# Patient Record
Sex: Male | Born: 1990 | Race: White | Hispanic: Yes | Marital: Single | Smoking: Former smoker
Health system: Southern US, Community
[De-identification: ages and names within clinical notes are randomized; demographics above are authoritative.]

## PROBLEM LIST (undated history)

## (undated) DIAGNOSIS — T7840XA Allergy, unspecified, initial encounter: Secondary | ICD-10-CM

## (undated) DIAGNOSIS — R569 Unspecified convulsions: Secondary | ICD-10-CM

## (undated) HISTORY — DX: Unspecified convulsions: R56.9

## (undated) HISTORY — DX: Allergy, unspecified, initial encounter: T78.40XA

---
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
## 2011-04-22 IMAGING — CT CT HEAD WITHOUT CONTRAST
2 series · 15 of 30 positions shown, 19 images · non-contrast
Comparison: none

REASON FOR EXAM: new onset of seizure,Right frontal trauma
COMMENTS:

[Series 2: without · axial · non-contrast · 0.44mm/px · z∈[+956,+1086]mm · 13 of 32 slices shown, 17 images]
[im 3/32  brain]
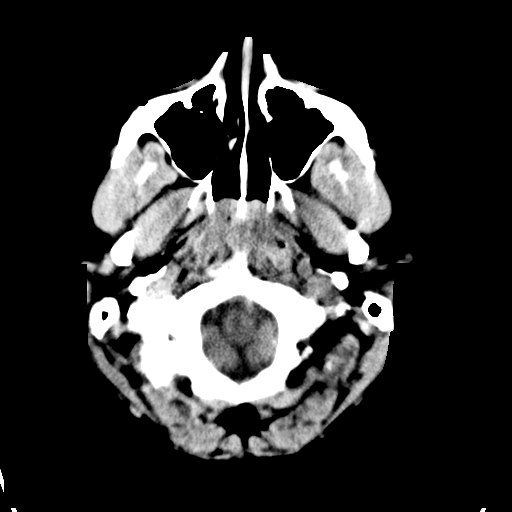
[im 3/32  bone]
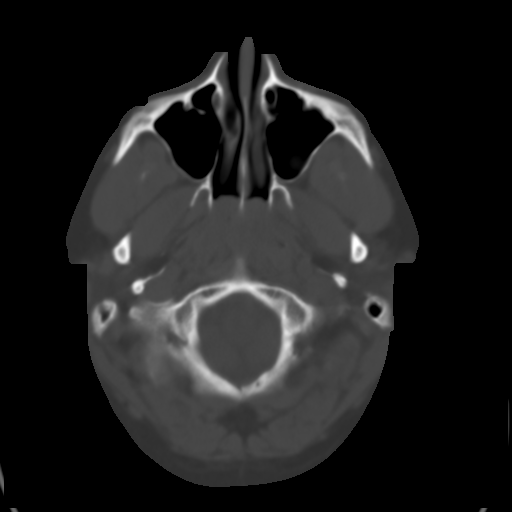
[im 5/32  brain]
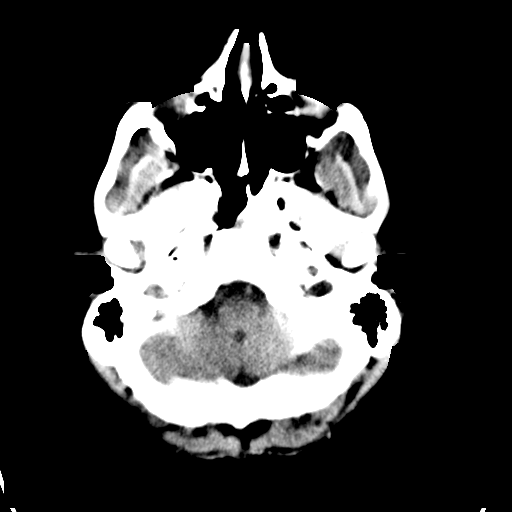
[im 7/32  brain]
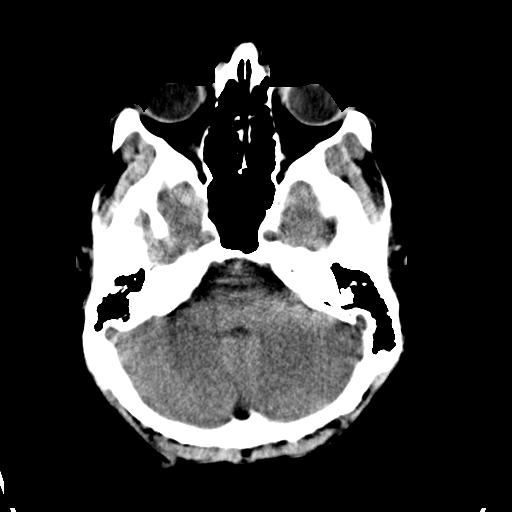
[im 9/32  brain]
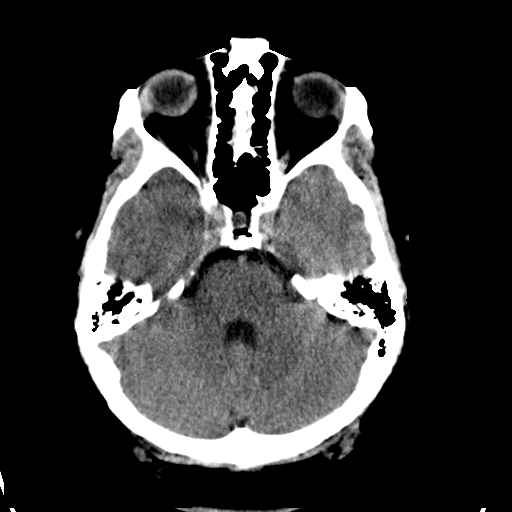
[im 12/32  brain]
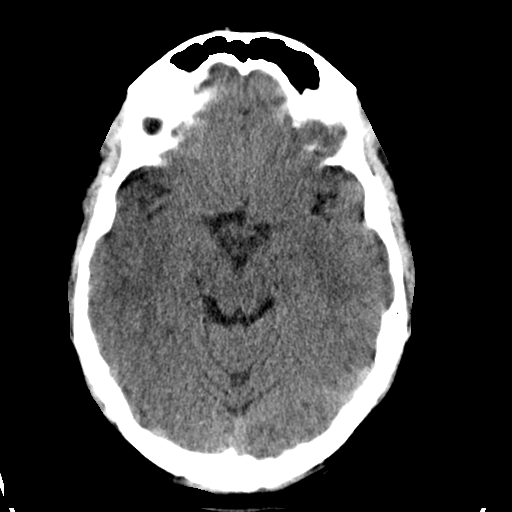
[im 12/32  bone]
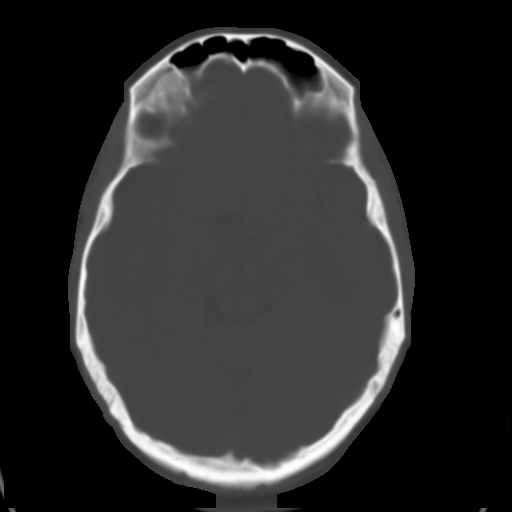
[im 14/32  brain]
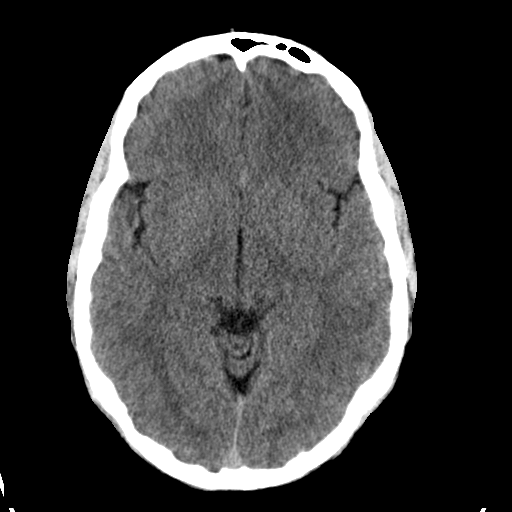
[im 16/32  brain]
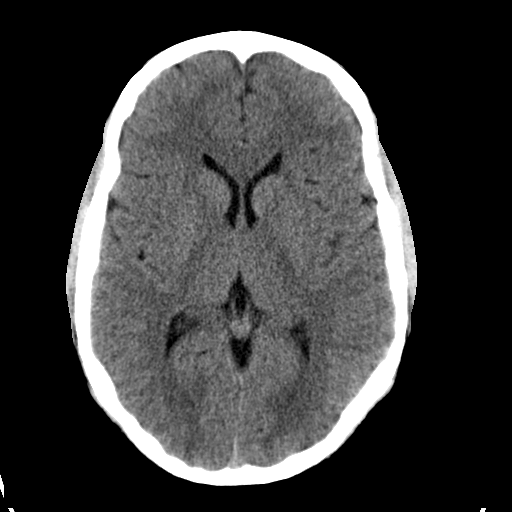
[im 18/32  brain]
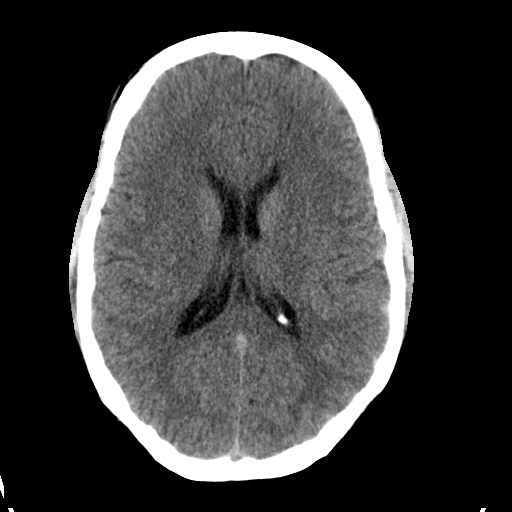
[im 20/32  brain]
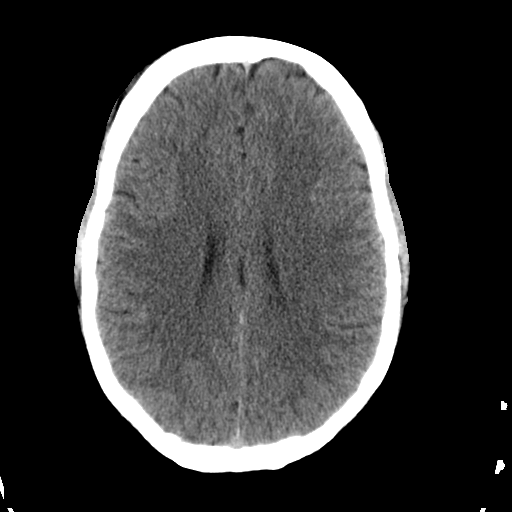
[im 20/32  bone]
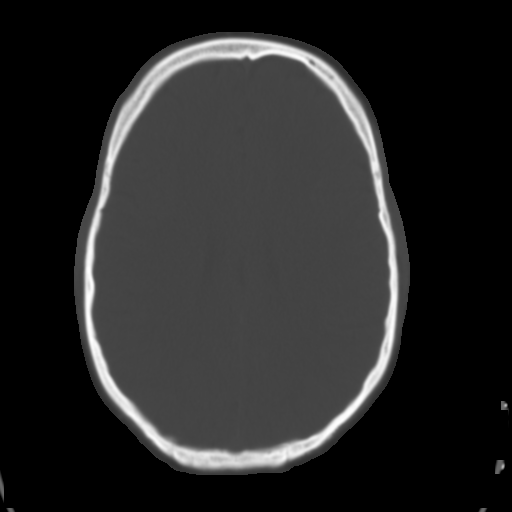
[im 23/32  brain]
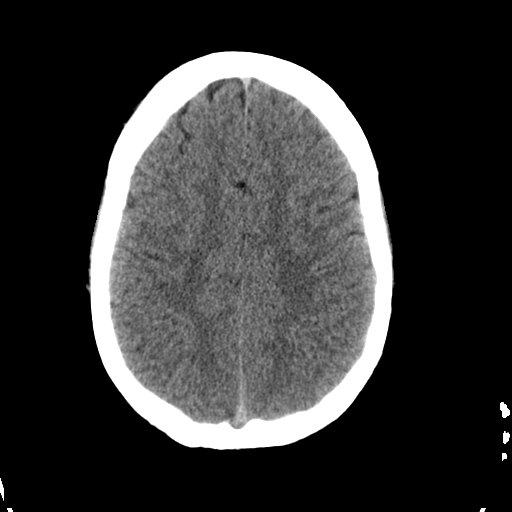
[im 25/32  brain]
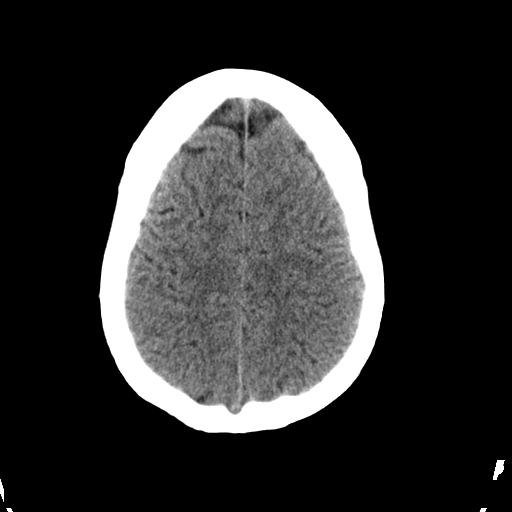
[im 27/32  brain]
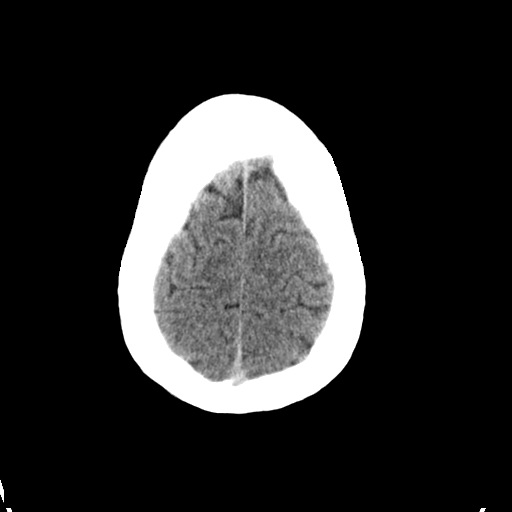
[im 29/32  brain]
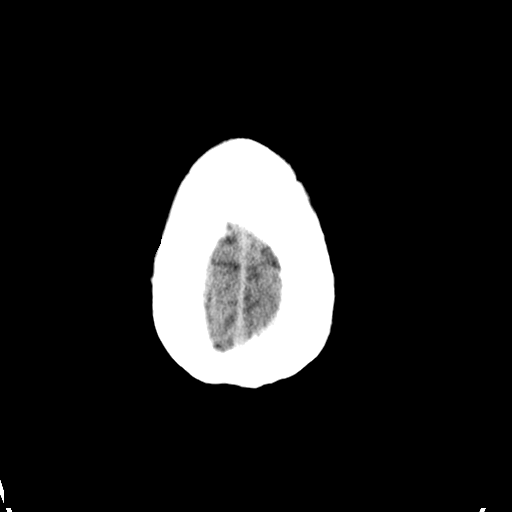
[im 29/32  bone]
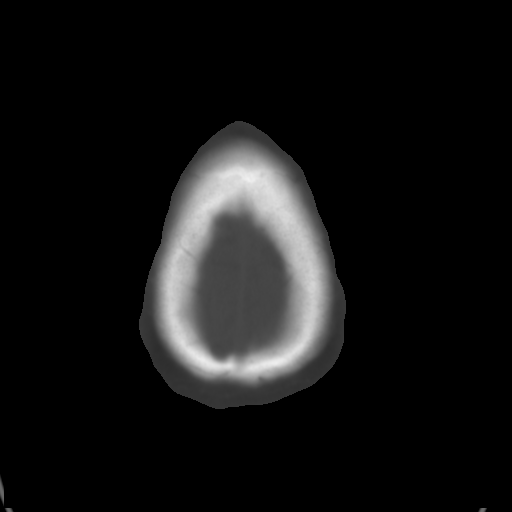

[Series 3: bone · axial · 0.44mm/px · z∈[+956,+976]mm · 2 of 32 slices shown]
[im 3/32  bone]
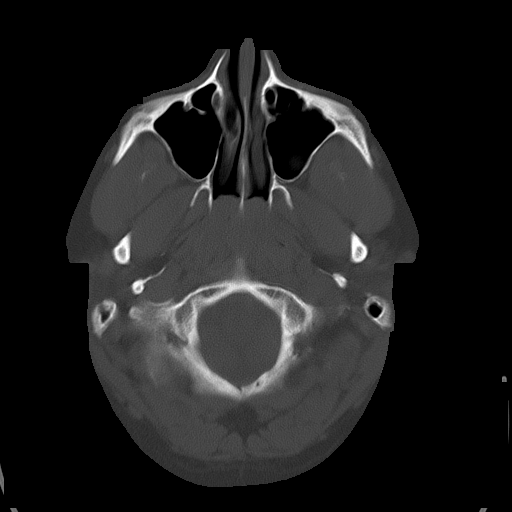
[im 7/32  bone]
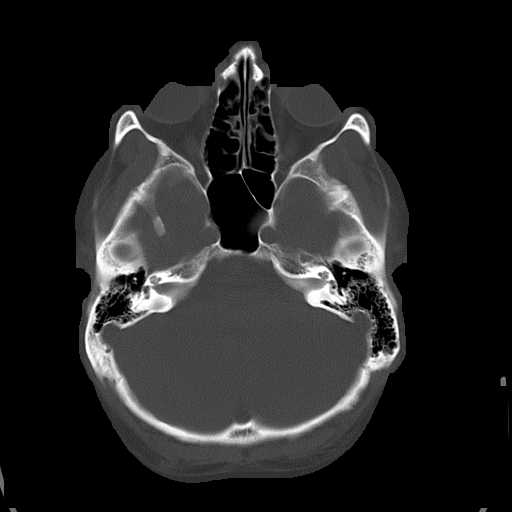

[15 of 30 positions shown; findings below may reference images not displayed]

PROCEDURE:     CT  - CT HEAD WITHOUT CONTRAST  - October 03, 2009  [DATE]

RESULT:

Helical 5mm sections were obtained from the skull base through the vertex
without the administration of intravenous contrast.

There is no evidence of intra-axial or extra-axial fluid collections. There
is no evidence of acute hemorrhage or secondary signs reflecting mass effect
or subacute or chronic focal territorial infarction. The osseous structures
demonstrate no evidence of a depressed skull fracture. If there is
persistent concern clinical, follow-up with MRI is recommended.
IMPRESSION: 1. No evidence of acute intracranial abnormalities.
2. Dr. Benda of the Emergency Department was informed of these findings at
the time of the initial interpretation.
3. Incidental note is made of a mucous retention cyst versus a polyp within
the base of left maxillary sinus.

## 2012-10-27 ENCOUNTER — Ambulatory Visit: Payer: Self-pay

## 2012-11-07 ENCOUNTER — Ambulatory Visit: Payer: Self-pay

## 2015-08-21 ENCOUNTER — Telehealth: Payer: Self-pay | Admitting: *Deleted

## 2019-08-18 ENCOUNTER — Ambulatory Visit (INDEPENDENT_AMBULATORY_CARE_PROVIDER_SITE_OTHER): Payer: BC Managed Care – PPO | Admitting: Family Medicine

## 2019-08-18 ENCOUNTER — Encounter: Payer: Self-pay | Admitting: Family Medicine

## 2019-08-18 ENCOUNTER — Other Ambulatory Visit: Payer: Self-pay

## 2019-08-18 VITALS — BP 134/82 | HR 77 | Temp 98.1°F | Ht 75.5 in | Wt 353.0 lb

## 2019-08-18 DIAGNOSIS — F329 Major depressive disorder, single episode, unspecified: Secondary | ICD-10-CM | POA: Diagnosis not present

## 2019-08-18 DIAGNOSIS — Z7689 Persons encountering health services in other specified circumstances: Secondary | ICD-10-CM | POA: Diagnosis not present

## 2019-08-18 DIAGNOSIS — R152 Fecal urgency: Secondary | ICD-10-CM | POA: Diagnosis not present

## 2019-08-18 DIAGNOSIS — F419 Anxiety disorder, unspecified: Secondary | ICD-10-CM

## 2019-08-18 MED ORDER — DICYCLOMINE HCL 10 MG PO CAPS: 10.0000 mg | ORAL_CAPSULE | Freq: Three times a day (TID) | ORAL | 0 refills | Status: DC

## 2019-08-18 MED ORDER — VENLAFAXINE HCL ER 37.5 MG PO CP24: 37.5000 mg | ORAL_CAPSULE | Freq: Every day | ORAL | 0 refills | Status: DC

## 2019-08-18 MED ORDER — HYDROXYZINE HCL 25 MG PO TABS: 25.0000 mg | ORAL_TABLET | Freq: Three times a day (TID) | ORAL | 0 refills | Status: DC | PRN

## 2019-08-18 NOTE — Progress Notes (Signed)
BP 134/82   Pulse 77   Temp 98.1 F (36.7 C) (Oral)   Ht 6' 3.5" (1.918 m)   Wt (!) 353 lb (160.1 kg)   SpO2 97%   BMI 43.54 kg/m    Subjective:    Patient ID: William Reid, male    DOB: 03-27-1991, 29 y.o.   MRN: [id number]  HPI: [name] is a 29 y.o. male  Chief Complaint  Patient presents with  . Establish Care    pt would like to discuss about stomch problems, anxiety, depression, sexual dysfunction and check his thyroids  . Foot Pain    left foot   Here today to establish care.   Main concern today is depression/anxiety. Has never been diagnosed in the past but has had issues all of his life. The past 2 years things have been worsening and about 4 years ago considered in a serious way taking his life, still having some SI/HI several times monthly but states it's mostly passive at this point. Recently met a serious girlfriend who he states has been very good for him and has emotionally supported him a great deal. Has never tried medications or counseling in the past but is open to all.   Concerned about bowel irritation, often after meals will have to run to the bathroom. Urgent need for a bathroom, not always diarrhea. Certain foods seem worse than others - [place] food, [place] food seem to be the worst triggers. Probiotics have maybe helped him some. Denies bloody stools, constipation, abdominal pain, N/V, weight loss.   Relevant past medical, surgical, family and social history reviewed and updated as indicated. Interim medical history since our last visit reviewed. Allergies and medications reviewed and updated.  Review of Systems  Per HPI unless specifically indicated above     Objective:    BP 134/82   Pulse 77   Temp 98.1 F (36.7 C) (Oral)   Ht 6' 3.5" (1.918 m)   Wt (!) 353 lb (160.1 kg)   SpO2 97%   BMI 43.54 kg/m   Wt Readings from Last 3 Encounters:  08/18/19 (!) 353 lb (160.1 kg)    Physical Exam Vitals and nursing note reviewed.    Constitutional:      Appearance: Normal appearance.  HENT:     Head: Atraumatic.  Eyes:     Extraocular Movements: Extraocular movements intact.     Conjunctiva/sclera: Conjunctivae normal.  Cardiovascular:     Rate and Rhythm: Normal rate and regular rhythm.  Pulmonary:     Effort: Pulmonary effort is normal.     Breath sounds: Normal breath sounds.  Musculoskeletal:        General: Normal range of motion.     Cervical back: Normal range of motion and neck supple.  Skin:    General: Skin is warm and dry.  Neurological:     General: No focal deficit present.     Mental Status: He is oriented to person, place, and time.  Psychiatric:        Mood and Affect: Mood normal.        Thought Content: Thought content normal.        Judgment: Judgment normal.     No results found for this or any previous visit.    Assessment & Plan:   Problem List Items Addressed This Visit      Other   Anxiety and depression - Primary    Given severity of sxs and hx  of SI at times with intent, will place urgent referral to Psychiatry. Will start effexor and prn hydroxyzine in meantime and monitor closely for benefit prior to establishing with specialist. Long discussion regarding ER, mobile crisis info, or multiple walk in Psychiatry clinics in area in case having SI/HI and pt contracted for safety      Relevant Medications   venlafaxine XR (EFFEXOR XR) 37.5 MG 24 hr capsule   hydrOXYzine (ATARAX/VISTARIL) 25 MG tablet   Other Relevant Orders   Ambulatory referral to Psychiatry    Other Visit Diagnoses    Encounter to establish care       Pt with numerous other concerns today that were not yet evaluated, will eval in detail at upcoming CPE   Fecal urgency       Tx with prn bentyl, fiber, continued probiotics. F/u at upcoming CPE on sxs       Follow up plan: Return for CPE.

## 2019-08-20 DIAGNOSIS — F419 Anxiety disorder, unspecified: Secondary | ICD-10-CM | POA: Insufficient documentation

## 2019-08-20 DIAGNOSIS — F32A Depression, unspecified: Secondary | ICD-10-CM | POA: Insufficient documentation

## 2019-08-20 NOTE — Assessment & Plan Note (Signed)
Given severity of sxs and hx of SI at times with intent, will place urgent referral to Psychiatry. Will start effexor and prn hydroxyzine in meantime and monitor closely for benefit prior to establishing with specialist. Long discussion regarding ER, mobile crisis info, or multiple walk in Psychiatry clinics in area in case having SI/HI and pt contracted for safety

## 2019-08-24 ENCOUNTER — Encounter: Payer: BC Managed Care – PPO | Admitting: Family Medicine

## 2019-08-31 ENCOUNTER — Encounter: Payer: Self-pay | Admitting: Family Medicine

## 2019-08-31 ENCOUNTER — Other Ambulatory Visit: Payer: Self-pay

## 2019-08-31 ENCOUNTER — Ambulatory Visit (INDEPENDENT_AMBULATORY_CARE_PROVIDER_SITE_OTHER): Payer: BC Managed Care – PPO | Admitting: Family Medicine

## 2019-08-31 VITALS — BP 136/86 | HR 68 | Temp 98.0°F | Ht 75.0 in | Wt 352.0 lb

## 2019-08-31 DIAGNOSIS — Z23 Encounter for immunization: Secondary | ICD-10-CM

## 2019-08-31 DIAGNOSIS — Z136 Encounter for screening for cardiovascular disorders: Secondary | ICD-10-CM | POA: Diagnosis not present

## 2019-08-31 DIAGNOSIS — Z0000 Encounter for general adult medical examination without abnormal findings: Secondary | ICD-10-CM | POA: Diagnosis not present

## 2019-08-31 DIAGNOSIS — F419 Anxiety disorder, unspecified: Secondary | ICD-10-CM | POA: Diagnosis not present

## 2019-08-31 DIAGNOSIS — F329 Major depressive disorder, single episode, unspecified: Secondary | ICD-10-CM

## 2019-08-31 DIAGNOSIS — Z114 Encounter for screening for human immunodeficiency virus [HIV]: Secondary | ICD-10-CM | POA: Diagnosis not present

## 2019-08-31 DIAGNOSIS — R197 Diarrhea, unspecified: Secondary | ICD-10-CM

## 2019-08-31 DIAGNOSIS — F32A Depression, unspecified: Secondary | ICD-10-CM

## 2019-08-31 LAB — UA/M W/RFLX CULTURE, ROUTINE
Bilirubin, UA: NEGATIVE
Glucose, UA: NEGATIVE
Ketones, UA: NEGATIVE
Leukocytes,UA: NEGATIVE
Nitrite, UA: NEGATIVE
Protein,UA: NEGATIVE
Specific Gravity, UA: 1.02 (ref 1.005–1.030)
Urobilinogen, Ur: 0.2 mg/dL (ref 0.2–1.0)
pH, UA: 6.5 (ref 5.0–7.5)

## 2019-08-31 LAB — MICROSCOPIC EXAMINATION
Bacteria, UA: NONE SEEN
WBC, UA: NONE SEEN /hpf (ref 0–5)

## 2019-08-31 NOTE — Assessment & Plan Note (Signed)
Improving with current regimen, may try increasing effexor to 2 capsules daily and monitoring for benefit. Await est care with Psychiatry for further mgmt

## 2019-08-31 NOTE — Progress Notes (Signed)
BP 136/86   Pulse 68   Temp 98 F (36.7 C) (Oral)   Ht 6\' 3"  (1.905 m)   Wt (!) 352 lb (159.7 kg)   SpO2 97%   BMI 44.00 kg/m    Subjective:    Patient ID: , male    DOB: [birth year], 29 y.o.   MRN: [id number]  HPI: William Reid is a 29 y.o. male presenting on 08/31/2019 for comprehensive medical examination. Current medical complaints include:see below  Anxiety and depression - Taking the hydroxyzine 1-2 times daily, and notes significant benefits with the effexor. Denies side effects, SI/HI. Awaiting est care with Psychiatry.   The bentyl has been helping calm down his diarrhea and urgency. Has only taken it a few times so far but does note improvement. No new concerns there.   He currently lives with: Interim Problems from his last visit: no  Depression Screen done today and results listed below:  Depression screen [facility] 2/9 08/31/2019 08/18/2019  Decreased Interest 1 2  Down, Depressed, Hopeless 1 3  PHQ - 2 Score 2 5  Altered sleeping 1 3  Tired, decreased energy 1 3  Change in appetite 2 1  Feeling bad or failure about yourself  2 2  Trouble concentrating 2 2  Moving slowly or fidgety/restless 1 1  Suicidal thoughts 1 2  PHQ-9 Score 12 19    The patient does not have a history of falls. I did complete a risk assessment for falls. A plan of care for falls was documented.   Past Medical History:  Past Medical History:  Diagnosis Date  . Seizures (HCC)     Surgical History:  No past surgical history on file.  Medications:  Current Outpatient Medications on File Prior to Visit  Medication Sig  . dicyclomine (BENTYL) 10 MG capsule Take 1 capsule (10 mg total) by mouth 4 (four) times daily -  before meals and at bedtime.  . hydrOXYzine (ATARAX/VISTARIL) 25 MG tablet Take 1 tablet (25 mg total) by mouth 3 (three) times daily as needed.  . Multiple Vitamin (MULTIVITAMIN PO) Take by mouth daily.  . Probiotic Product (PROBIOTIC DAILY PO) Take by mouth  daily.  08/20/2019 UNABLE TO FIND Take by mouth 2 (two) times daily. Equate Vitality Complex for Men with Testofen  . venlafaxine XR (EFFEXOR XR) 37.5 MG 24 hr capsule Take 1 capsule (37.5 mg total) by mouth daily with breakfast.   No current facility-administered medications on file prior to visit.    Allergies:  No Known Allergies  Social History:  Social History   Socioeconomic History  . Marital status: Single    Spouse name: Not on file  . Number of children: Not on file  . Years of education: Not on file  . Highest education level: Not on file  Occupational History  . Not on file  Tobacco Use  . Smoking status: Former Marland Kitchen  . Smokeless tobacco: Never Used  Substance and Sexual Activity  . Alcohol use: Yes    Comment: occasionally  . Drug use: Not Currently    Types: Marijuana  . Sexual activity: Yes  Other Topics Concern  . Not on file  Social History Narrative  . Not on file   Social Determinants of Health   Financial Resource Strain:   . Difficulty of Paying Living Expenses:   Food Insecurity:   . Worried About Games developer in the Last Year:   . Programme researcher, broadcasting/film/video of [employer]  in the Last Year:   Transportation Needs:   . Freight forwarder (Medical):   Marland Kitchen Lack of Transportation (Non-Medical):   Physical Activity:   . Days of Exercise per Week:   . Minutes of Exercise per Session:   Stress:   . Feeling of Stress :   Social Connections:   . Frequency of Communication with Friends and Family:   . Frequency of Social Gatherings with Friends and Family:   . Attends Religious Services:   . Active Member of Clubs or Organizations:   . Attends Banker Meetings:   Marland Kitchen Marital Status:   Intimate Partner Violence:   . Fear of Current or Ex-Partner:   . Emotionally Abused:   Marland Kitchen Physically Abused:   . Sexually Abused:    Social History   Tobacco Use  Smoking Status Former Smoker  Smokeless Tobacco Never Used   Social History   Substance and Sexual  Activity  Alcohol Use Yes   Comment: occasionally    Family History:  Family History  Problem Relation Age of Onset  . Epilepsy Father   . Hypertension Father     Past medical history, surgical history, medications, allergies, family history and social history reviewed with patient today and changes made to appropriate areas of the chart.   Review of Systems - General ROS: negative Psychological ROS: negative Ophthalmic ROS: negative ENT ROS: negative Allergy and Immunology ROS: negative Hematological and Lymphatic ROS: negative Endocrine ROS: negative Breast ROS: negative for breast lumps Respiratory ROS: no cough, shortness of breath, or wheezing Cardiovascular ROS: no chest pain or dyspnea on exertion Gastrointestinal ROS: no abdominal pain, change in bowel habits, or black or bloody stools Genito-Urinary ROS: no dysuria, trouble voiding, or hematuria Musculoskeletal ROS: negative Neurological ROS: no TIA or stroke symptoms Dermatological ROS: negative All other ROS negative except what is listed above and in the HPI.      Objective:    BP 136/86   Pulse 68   Temp 98 F (36.7 C) (Oral)   Ht 6\' 3"  (1.905 m)   Wt (!) 352 lb (159.7 kg)   SpO2 97%   BMI 44.00 kg/m   Wt Readings from Last 3 Encounters:  08/31/19 (!) 352 lb (159.7 kg)  08/18/19 (!) 353 lb (160.1 kg)    Physical Exam Vitals and nursing note reviewed.  Constitutional:      General: He is not in acute distress.    Appearance: He is well-developed.  HENT:     Head: Atraumatic.     Right Ear: Tympanic membrane and external ear normal.     Left Ear: Tympanic membrane and external ear normal.     Nose: Nose normal.     Mouth/Throat:     Mouth: Mucous membranes are moist.     Pharynx: Oropharynx is clear.  Eyes:     General: No scleral icterus.    Conjunctiva/sclera: Conjunctivae normal.     Pupils: Pupils are equal, round, and reactive to light.  Cardiovascular:     Rate and Rhythm: Normal rate  and regular rhythm.     Heart sounds: Normal heart sounds. No murmur.  Pulmonary:     Effort: Pulmonary effort is normal. No respiratory distress.     Breath sounds: Normal breath sounds.  Abdominal:     General: Bowel sounds are normal. There is no distension.     Palpations: Abdomen is soft. There is no mass.     Tenderness: There is no  abdominal tenderness. There is no guarding.  Genitourinary:    Comments: GU exam declined Musculoskeletal:        General: No tenderness. Normal range of motion.     Cervical back: Normal range of motion and neck supple.  Skin:    General: Skin is warm and dry.     Findings: No rash.  Neurological:     General: No focal deficit present.     Mental Status: He is alert and oriented to person, place, and time.     Deep Tendon Reflexes: Reflexes are normal and symmetric.  Psychiatric:        Mood and Affect: Mood normal.        Behavior: Behavior normal.        Thought Content: Thought content normal.        Judgment: Judgment normal.    No results found for this or any previous visit.    Assessment & Plan:   Problem List Items Addressed This Visit      Other   Anxiety and depression - Primary    Improving with current regimen, may try increasing effexor to 2 capsules daily and monitoring for benefit. Await est care with Psychiatry for further mgmt       Other Visit Diagnoses    Annual physical exam       Relevant Orders   CBC with Differential/Platelet   Comprehensive metabolic panel   TSH   UA/M w/rflx Culture, Routine   Diarrhea, unspecified type       Improved on bentyl, recommended probiotics, diet changes. Continue to monitor   Need for Tdap vaccination       Relevant Orders   Tdap vaccine greater than or equal to 7yo IM (Completed)   Screening for cardiovascular condition       Relevant Orders   Lipid Panel w/o Chol/HDL Ratio   Encounter for screening for HIV       Relevant Orders   HIV Antibody (routine testing w rflx)        Discussed aspirin prophylaxis for myocardial infarction prevention and decision was it was not indicated  LABORATORY TESTING:  Health maintenance labs ordered today as discussed above.   The natural history of prostate cancer and ongoing controversy regarding screening and potential treatment outcomes of prostate cancer has been discussed with the patient. The meaning of a false positive PSA and a false negative PSA has been discussed. He indicates understanding of the limitations of this screening test and wishes not to proceed with screening PSA testing.   IMMUNIZATIONS:   - Tdap: Tetanus vaccination status reviewed: Td vaccination indicated and given today. - Influenza: Postponed to flu season  PATIENT COUNSELING:    Sexuality: Discussed sexually transmitted diseases, partner selection, use of condoms, avoidance of unintended pregnancy  and contraceptive alternatives.   Advised to avoid cigarette smoking.  I discussed with the patient that most people either abstain from alcohol or drink within safe limits (<=14/week and <=4 drinks/occasion for males, <=7/weeks and <= 3 drinks/occasion for females) and that the risk for alcohol disorders and other health effects rises proportionally with the number of drinks per week and how often a drinker exceeds daily limits.  Discussed cessation/primary prevention of drug use and availability of treatment for abuse.   Diet: Encouraged to adjust caloric intake to maintain  or achieve ideal body weight, to reduce intake of dietary saturated fat and total fat, to limit sodium intake by avoiding high sodium foods and  not adding table salt, and to maintain adequate dietary potassium and calcium preferably from fresh fruits, vegetables, and low-fat dairy products.    stressed the importance of regular exercise  Injury prevention: Discussed safety belts, safety helmets, smoke detector, smoking near bedding or upholstery.   Dental health: Discussed  importance of regular tooth brushing, flossing, and dental visits.   Follow up plan: NEXT PREVENTATIVE PHYSICAL DUE IN 1 YEAR. Return in about 1 year (around 08/30/2020) for CPE.

## 2019-09-01 ENCOUNTER — Encounter: Payer: Self-pay | Admitting: Family Medicine

## 2019-09-01 LAB — CBC WITH DIFFERENTIAL/PLATELET
Basophils Absolute: 0.1 10*3/uL (ref 0.0–0.2)
Basos: 1 %
EOS (ABSOLUTE): 0.3 10*3/uL (ref 0.0–0.4)
Eos: 4 %
Hematocrit: 46.3 % (ref 37.5–51.0)
Hemoglobin: 15.3 g/dL (ref 13.0–17.7)
Immature Grans (Abs): 0 10*3/uL (ref 0.0–0.1)
Immature Granulocytes: 0 %
Lymphocytes Absolute: 2.2 10*3/uL (ref 0.7–3.1)
Lymphs: 30 %
MCH: 29.2 pg (ref 26.6–33.0)
MCHC: 33 g/dL (ref 31.5–35.7)
MCV: 88 fL (ref 79–97)
Monocytes Absolute: 0.5 10*3/uL (ref 0.1–0.9)
Monocytes: 7 %
Neutrophils Absolute: 4.2 10*3/uL (ref 1.4–7.0)
Neutrophils: 58 %
Platelets: 239 10*3/uL (ref 150–450)
RBC: 5.24 x10E6/uL (ref 4.14–5.80)
RDW: 12.3 % (ref 11.6–15.4)
WBC: 7.2 10*3/uL (ref 3.4–10.8)

## 2019-09-01 LAB — LIPID PANEL W/O CHOL/HDL RATIO
Cholesterol, Total: 186 mg/dL (ref 100–199)
HDL: 32 mg/dL — ABNORMAL LOW (ref >39)
LDL Chol Calc (NIH): 123 mg/dL — ABNORMAL HIGH (ref 0–99)
Triglycerides: 175 mg/dL — ABNORMAL HIGH (ref 0–149)
VLDL Cholesterol Cal: 31 mg/dL (ref 5–40)

## 2019-09-01 LAB — COMPREHENSIVE METABOLIC PANEL
ALT: 34 IU/L (ref 0–44)
AST: 28 IU/L (ref 0–40)
Albumin/Globulin Ratio: 1.6 (ref 1.2–2.2)
Albumin: 4.6 g/dL (ref 4.1–5.2)
Alkaline Phosphatase: 95 IU/L (ref 48–121)
BUN/Creatinine Ratio: 13 (ref 9–20)
BUN: 11 mg/dL (ref 6–20)
Bilirubin Total: 0.4 mg/dL (ref 0.0–1.2)
CO2: 21 mmol/L (ref 20–29)
Calcium: 9.5 mg/dL (ref 8.7–10.2)
Chloride: 101 mmol/L (ref 96–106)
Creatinine, Ser: 0.86 mg/dL (ref 0.76–1.27)
GFR calc Af Amer: 135 mL/min/{1.73_m2} (ref >59)
GFR calc non Af Amer: 117 mL/min/{1.73_m2} (ref >59)
Globulin, Total: 2.8 g/dL (ref 1.5–4.5)
Glucose: 82 mg/dL (ref 65–99)
Potassium: 4.3 mmol/L (ref 3.5–5.2)
Sodium: 138 mmol/L (ref 134–144)
Total Protein: 7.4 g/dL (ref 6.0–8.5)

## 2019-09-01 LAB — HIV ANTIBODY (ROUTINE TESTING W REFLEX): HIV Screen 4th Generation wRfx: NONREACTIVE

## 2019-09-01 LAB — TSH: TSH: 0.912 u[IU]/mL (ref 0.450–4.500)

## 2019-09-02 ENCOUNTER — Other Ambulatory Visit: Payer: Self-pay | Admitting: Family Medicine

## 2019-09-02 NOTE — Telephone Encounter (Signed)
Requested Prescriptions  Pending Prescriptions Disp Refills  . dicyclomine (BENTYL) 10 MG capsule [Pharmacy Med Name: DICYCLOMINE 10 MG CAPSULE] 90 capsule 0    Sig: TAKE 1 CAPSULE (10 MG TOTAL) BY MOUTH 4 (FOUR) TIMES DAILY - BEFORE MEALS AND AT BEDTIME.     Gastroenterology:  Antispasmodic Agents Passed - 09/02/2019  9:31 AM      Passed - Last Heart Rate in normal range    Pulse Readings from Last 1 Encounters:  08/31/19 68         Passed - Valid encounter within last 12 months    Recent Outpatient Visits          2 days ago Anxiety and depression   [facility] [place] Part[place], [place]   2 weeks ago Anxiety and depression   [facility] [place] M[place], [place]

## 2019-09-10 ENCOUNTER — Other Ambulatory Visit: Payer: Self-pay | Admitting: Family Medicine

## 2019-10-16 ENCOUNTER — Ambulatory Visit: Payer: BC Managed Care – PPO | Admitting: Family Medicine

## 2019-11-03 ENCOUNTER — Ambulatory Visit: Payer: BC Managed Care – PPO | Admitting: Family Medicine

## 2019-11-03 ENCOUNTER — Other Ambulatory Visit: Payer: Self-pay

## 2019-11-03 ENCOUNTER — Encounter: Payer: Self-pay | Admitting: Family Medicine

## 2019-11-03 VITALS — BP 137/82 | HR 76 | Temp 98.2°F | Wt 364.0 lb

## 2019-11-03 DIAGNOSIS — J351 Hypertrophy of tonsils: Secondary | ICD-10-CM | POA: Diagnosis not present

## 2019-11-03 NOTE — Patient Instructions (Signed)
Zyrtec in the AM, flonase or rhinocort nasal spray twice daily

## 2019-11-03 NOTE — Progress Notes (Signed)
BP 137/82   Pulse 76   Temp 98.2 F (36.8 C) (Oral)   Wt (!) 364 lb (165.1 kg)   SpO2 98%   BMI 45.50 kg/m    Subjective:    Patient ID: William Reid, male    DOB: [birth year], 30 y.o.   MRN: [id number]  HPI: William Reid is a 29 y.o. male  Chief Complaint  Patient presents with  . Sore Throat    pt states that has had swollen tonsiles for about 2 months   Swollen tonsils the past 2 months to where he's choking on saliva and while eating at times. Choking in his sleep often as well. No pain, fever, chills, N/V. Does have a hx of allergies and notes some post nasal drainage, not on anything for this. Notes he's not had such a noticeable issue with his tonsils that he can remember in the past.   Relevant past medical, surgical, family and social history reviewed and updated as indicated. Interim medical history since our last visit reviewed. Allergies and medications reviewed and updated.  Review of Systems  Per HPI unless specifically indicated above     Objective:    BP 137/82   Pulse 76   Temp 98.2 F (36.8 C) (Oral)   Wt (!) 364 lb (165.1 kg)   SpO2 98%   BMI 45.50 kg/m   Wt Readings from Last 3 Encounters:  11/03/19 (!) 364 lb (165.1 kg)  08/31/19 (!) 352 lb (159.7 kg)  08/18/19 (!) 353 lb (160.1 kg)    Physical Exam Vitals and nursing note reviewed.  Constitutional:      Appearance: Normal appearance.  HENT:     Head: Atraumatic.     Right Ear: Tympanic membrane normal.     Left Ear: Tympanic membrane normal.     Nose: Nose normal.     Mouth/Throat:     Mouth: Mucous membranes are moist.     Pharynx: Oropharynx is clear. Posterior oropharyngeal erythema present.     Comments: Tonsils edematous b/l, right significantly larger than left. Not cryptic, no exudates  Eyes:     Extraocular Movements: Extraocular movements intact.     Conjunctiva/sclera: Conjunctivae normal.  Neck:     Comments: No thyromegaly appreciated on exam Cardiovascular:      Rate and Rhythm: Normal rate and regular rhythm.  Pulmonary:     Effort: Pulmonary effort is normal.     Breath sounds: Normal breath sounds.  Musculoskeletal:        General: Normal range of motion.     Cervical back: Normal range of motion and neck supple.  Skin:    General: Skin is warm and dry.  Neurological:     General: No focal deficit present.     Mental Status: He is oriented to person, place, and time.  Psychiatric:        Mood and Affect: Mood normal.        Thought Content: Thought content normal.        Judgment: Judgment normal.     Results for orders placed or performed in visit on 08/31/19  Microscopic Examination   URINE  Result Value Ref Range   WBC, UA None seen 0 - 5 /hpf   RBC 0-2 0 - 2 /hpf   Epithelial Cells (non renal) 0-10 0 - 10 /hpf   Bacteria, UA None seen None seen/Few  HIV Antibody (routine testing w rflx)  Result Value Ref Range  HIV Screen 4th Generation wRfx Non Reactive Non Reactive  CBC with Differential/Platelet  Result Value Ref Range   WBC 7.2 3.4 - 10.8 x10E3/uL   RBC 5.24 4.14 - 5.80 x10E6/uL   Hemoglobin 15.3 13.0 - 17.7 g/dL   Hematocrit 77.9 39.0 - 51.0 %   MCV 88 79 - 97 fL   MCH 29.2 26.6 - 33.0 pg   MCHC 33.0 31 - 35 g/dL   RDW 30.0 92.3 - 30.0 %   Platelets 239 150 - 450 x10E3/uL   Neutrophils 58 Not Estab. %   Lymphs 30 Not Estab. %   Monocytes 7 Not Estab. %   Eos 4 Not Estab. %   Basos 1 Not Estab. %   Neutrophils Absolute 4.2 1 - 7 x10E3/uL   Lymphocytes Absolute 2.2 0 - 3 x10E3/uL   Monocytes Absolute 0.5 0 - 0 x10E3/uL   EOS (ABSOLUTE) 0.3 0.0 - 0.4 x10E3/uL   Basophils Absolute 0.1 0 - 0 x10E3/uL   Immature Granulocytes 0 Not Estab. %   Immature Grans (Abs) 0.0 0.0 - 0.1 x10E3/uL  Comprehensive metabolic panel  Result Value Ref Range   Glucose 82 65 - 99 mg/dL   BUN 11 6 - 20 mg/dL   Creatinine, Ser 7.62 0.76 - 1.27 mg/dL   GFR calc non Af Amer 117 >59 mL/min/1.73   GFR calc Af Amer 135 >59 mL/min/1.73     BUN/Creatinine Ratio 13 9 - 20   Sodium 138 134 - 144 mmol/L   Potassium 4.3 3.5 - 5.2 mmol/L   Chloride 101 96 - 106 mmol/L   CO2 21 20 - 29 mmol/L   Calcium 9.5 8.7 - 10.2 mg/dL   Total Protein 7.4 6.0 - 8.5 g/dL   Albumin 4.6 4.1 - 5.2 g/dL   Globulin, Total 2.8 1.5 - 4.5 g/dL   Albumin/Globulin Ratio 1.6 1.2 - 2.2   Bilirubin Total 0.4 0.0 - 1.2 mg/dL   Alkaline Phosphatase 95 48 - 121 IU/L   AST 28 0 - 40 IU/L   ALT 34 0 - 44 IU/L  Lipid Panel w/o Chol/HDL Ratio  Result Value Ref Range   Cholesterol, Total 186 100 - 199 mg/dL   Triglycerides 263 (H) 0 - 149 mg/dL   HDL 32 (L) >33 mg/dL   VLDL Cholesterol Cal 31 5 - 40 mg/dL   LDL Chol Calc (NIH) 545 (H) 0 - 99 mg/dL  TSH  Result Value Ref Range   TSH 0.912 0.450 - 4.500 uIU/mL  UA/M w/rflx Culture, Routine   Specimen: Urine   URINE  Result Value Ref Range   Specific Gravity, UA 1.020 1.005 - 1.030   pH, UA 6.5 5.0 - 7.5   Color, UA Yellow Yellow   Appearance Ur Clear Clear   Leukocytes,UA Negative Negative   Protein,UA Negative Negative/Trace   Glucose, UA Negative Negative   Ketones, UA Negative Negative   RBC, UA Trace (A) Negative   Bilirubin, UA Negative Negative   Urobilinogen, Ur 0.2 0.2 - 1.0 mg/dL   Nitrite, UA Negative Negative   Microscopic Examination See below:       Assessment & Plan:   Problem List Items Addressed This Visit    None    Visit Diagnoses    Swollen tonsil    -  Primary   Zyrtec and flonase recommended to reduce post nasal drainage, but given significant size and sensation of choking refer to ENT for further eval and mgmt as well  Relevant Orders   Ambulatory referral to ENT       Follow up plan: Return if symptoms worsen or fail to improve.

## 2019-11-06 ENCOUNTER — Ambulatory Visit: Payer: Self-pay | Attending: Internal Medicine

## 2019-11-06 DIAGNOSIS — Z23 Encounter for immunization: Secondary | ICD-10-CM

## 2019-11-06 NOTE — Progress Notes (Signed)
   Covid-19 Vaccination Clinic  Name:  William Reid    MRN: [id number] DOB: 13-Dec-1990  11/06/2019  Mr. Difatta was observed post Covid-19 immunization for 15 minutes without incident. He was provided with Vaccine Information Sheet and instruction to access the V-Safe system.   Mr. Strohmeier was instructed to call 911 with any severe reactions post vaccine: Marland Kitchen Difficulty breathing  . Swelling of face and throat  . A fast heartbeat  . A bad rash all over body  . Dizziness and weakness   Immunizations Administered    Name Date Dose VIS Date Route   Moderna COVID-19 Vaccine 11/06/2019 11:39 AM 0.5 mL 02/2019 Intramuscular   Manufacturer: Moderna   Lot: [id number]   NDC: 67014-103-01

## 2020-10-18 ENCOUNTER — Telehealth: Payer: Self-pay

## 2020-10-18 ENCOUNTER — Other Ambulatory Visit: Payer: Self-pay

## 2020-10-18 ENCOUNTER — Other Ambulatory Visit: Payer: Self-pay | Admitting: Nurse Practitioner

## 2020-10-18 ENCOUNTER — Encounter: Payer: Self-pay | Admitting: Nurse Practitioner

## 2020-10-18 ENCOUNTER — Ambulatory Visit (INDEPENDENT_AMBULATORY_CARE_PROVIDER_SITE_OTHER): Payer: BC Managed Care – PPO | Admitting: Nurse Practitioner

## 2020-10-18 VITALS — BP 138/80 | HR 67 | Temp 98.7°F | Wt 374.4 lb

## 2020-10-18 DIAGNOSIS — Z20828 Contact with and (suspected) exposure to other viral communicable diseases: Secondary | ICD-10-CM | POA: Diagnosis not present

## 2020-10-18 DIAGNOSIS — Z202 Contact with and (suspected) exposure to infections with a predominantly sexual mode of transmission: Secondary | ICD-10-CM | POA: Diagnosis not present

## 2020-10-18 MED ORDER — VALACYCLOVIR HCL 1 G PO TABS: 1000.0000 mg | ORAL_TABLET | Freq: Two times a day (BID) | ORAL | 0 refills | Status: DC

## 2020-10-18 NOTE — Assessment & Plan Note (Signed)
Acute with small area to scrotum that does not appear herpes-like, however he does endorse some burning sensation to penis recently.  Will send in Valtrex 1000 MG BID x 10 days, does have history of positive blood testing and exposure to ex who was positive.  Return as needed.

## 2020-10-18 NOTE — Telephone Encounter (Signed)
Called pt to see if he can come in earlier than appt to have labs done being that the lab is closing early today no answer left vm.

## 2020-10-18 NOTE — Patient Instructions (Signed)
[email or web address]">  Genital Herpes Genital herpes is a common sexually transmitted infection (STI) that is caused by a virus. The virus spreads from person to person through sexual contact. The infection can cause itching, blisters, and sores around the genitals or rectum. Symptoms may last for several days and then go away. This is called an outbreak. The virus remains in the body, however, so more outbreaks may happen in the future. The time between outbreaks varies and can be from months toyears. Genital herpes can affect anyone. It is particularly concerning for pregnant women because the virus can be passed to the baby during delivery and can cause serious problems. Genital herpes is also a concern for people who have a weak disease-fighting system (immune system). What are the causes? This condition is caused by the human herpesvirus, also called herpes simplex virus, type 1 or type 2 (HSV-1 or HSV-2). The virus may spread through: Sexual contact with an infected person, including vaginal, anal, and oral sex. Contact with fluid from a herpes sore. The skin. This means that you can get herpes from an infected partner even if there are no blisters or sores present. Your partner may not know that he or she is infected. What increases the risk? You are more likely to develop this condition if: You have sex with many partners. You do not use latex condoms during sex. What are the signs or symptoms? Most people do not have symptoms (are asymptomatic), or they have mild symptoms that may be mistaken for other skin problems. Symptoms may include: Small, red bumps near the genitals, rectum, or mouth. These bumps turn into blisters and then sores. Flu-like symptoms, including: Fever. Body aches. Swollen lymph nodes. Headache. Painful urination. Pain and itching in the genital area or rectal area. Vaginal discharge. Tingling  or shooting pain in the legs and buttocks. Generally, symptoms are more severe and last longer during the first (primary) outbreak. Flu-like symptoms are also more common during the primary outbreak. How is this diagnosed? This condition may be diagnosed based on: A physical exam. Your medical history. Blood tests. A test of a fluid sample (culture) from an open sore. How is this treated? There is no cure for this condition, but treatment with antiviral medicines that are taken by mouth (orally) can do the following: Speed up healing and relieve symptoms. Help to reduce the spread of the virus to sexual partners. Limit the chance of future outbreaks, or make future outbreaks shorter. Lessen symptoms of future outbreaks. Your health care provider may also recommend pain relief medicines, such asaspirin or ibuprofen. Follow these instructions at home: If you have an outbreak: Keep the affected areas dry and clean. Avoid rubbing or touching blisters and sores. If you do touch blisters or sores: Wash your hands thoroughly with soap and water. Do not touch your eyes afterward. To help relieve pain or itching, you may take the following actions as told by your health care provider: Apply a cold, wet cloth (cold compress) to affected areas 4-6 times a day. Apply a substance that protects your skin and reduces bleeding (astringent). Apply a gel that helps relieve pain around sores (lidocaine gel). Take a warm, shallow bath that cleans the genital area (sitz bath). Sexual activity Do not have sexual contact during active outbreaks. Practice safe sex. Herpes can spread even if your partner does not have blisters or sores. Latex condoms and male condoms may help prevent the spread of the herpes virus. General instructions Take over-the-counter and   prescription medicines only as told by your health care provider. Keep all follow-up visits as told by your health care provider. This is  important. How is this prevented? Use condoms. Although you can get genital herpes during sexual contact even with the use of a condom, a condom can provide some protection. Avoid having multiple sexual partners. Talk with your sexual partner about any symptoms either of you may have. Also, talk with your partner about any history of STIs. Get tested for STIs before you have sex. Ask your partner to do the same. Do not have sexual contact if you have active symptoms of genital herpes. Contact a health care provider if: Your symptoms are not improving with medicine. Your symptoms return, or you have new symptoms. You have a fever. You have abdominal pain. You have redness, swelling, or pain in your eye. You notice new sores on other parts of your body. You are a woman and you experience bleeding between menstrual periods. You have had herpes and you become pregnant or plan to become pregnant. Summary Genital herpes is a common sexually transmitted infection (STI) that is caused by the herpes simplex virus, type 1 or type 2 (HSV-1 or HSV-2). These viruses are most often spread through sexual contact with an infected person. You are more likely to develop this condition if you have sex with many partners or you do not use condoms during sex. Most people do not have symptoms (are asymptomatic) or have mild symptoms that may be mistaken for other skin problems. Symptoms occur as outbreaks that may happen months or years apart. There is no cure for this condition, but treatment with oral antiviral medicines can reduce symptoms, reduce the chance of spreading the virus to a partner, prevent future outbreaks, or shorten future outbreaks. This information is not intended to replace advice given to you by your health care provider. Make sure you discuss any questions you have with your healthcare provider. Document Revised: 11/24/2018 Document Reviewed: 11/24/2018 Elsevier Patient Education  2022  Elsevier Inc.  

## 2020-10-18 NOTE — Progress Notes (Signed)
BP 138/80   Pulse 67   Temp 98.7 F (37.1 C) (Oral)   Wt (!) 374 lb 6.4 oz (169.8 kg)   SpO2 98%   BMI 46.80 kg/m    Subjective:    Patient ID: William Reid, male    DOB: [birth year], 30 y.o.   MRN: [id number]  HPI: William Reid is a 30 y.o. male  Chief Complaint  Patient presents with   Exposure to STD    Pt states his ex girlfriend recently informed him that she had herpes. States she has noticed some bumps in his groin and the corner of his mouth   STD SCREENING Noticed lesion to penis 2-3 days ago, no drainage.  Her ex-partner did tell him she had herpes.  Has recently had increased stressors. Does has history of ingrown hairs. Sexual activity:  In a Monogamous Relationship Contraception: no Recent unprotected intercourse: yes History of sexually transmitted diseases: no Previous sexually transmitted disease screening: yes about one year ago -- they did tell him he was positive  Lifetime sexual partners:  Genital lesions: yes Penile discharge: no Dysuria: no Swollen lymph nodes: no Fevers: no Rash: no   Relevant past medical, surgical, family and social history reviewed and updated as indicated. Interim medical history since our last visit reviewed. Allergies and medications reviewed and updated.  Review of Systems  Constitutional:  Negative for activity change, diaphoresis, fatigue and fever.  Respiratory:  Negative for cough, chest tightness, shortness of breath and wheezing.   Cardiovascular:  Negative for chest pain, palpitations and leg swelling.  Gastrointestinal: Negative.   Genitourinary:  Positive for genital sores. Negative for dysuria, frequency, hematuria, penile discharge, penile pain, penile swelling and scrotal swelling.  Neurological: Negative.   Psychiatric/Behavioral: Negative.     Per HPI unless specifically indicated above     Objective:    BP 138/80   Pulse 67   Temp 98.7 F (37.1 C) (Oral)   Wt (!) 374 lb 6.4 oz (169.8 kg)    SpO2 98%   BMI 46.80 kg/m   Wt Readings from Last 3 Encounters:  10/18/20 (!) 374 lb 6.4 oz (169.8 kg)  11/03/19 (!) 364 lb (165.1 kg)  08/31/19 (!) 352 lb (159.7 kg)    Physical Exam Vitals and nursing note reviewed. Exam conducted with a chaperone present.  Constitutional:      General: He is awake. He is not in acute distress.    Appearance: He is well-developed and well-groomed. He is obese. He is not ill-appearing or toxic-appearing.  HENT:     Head: Normocephalic and atraumatic.     Right Ear: Hearing normal. No drainage.     Left Ear: Hearing normal. No drainage.     Mouth/Throat:     Pharynx: Uvula midline.  Eyes:     General: Lids are normal.        Right eye: No discharge.        Left eye: No discharge.     Conjunctiva/sclera: Conjunctivae normal.     Pupils: Pupils are equal, round, and reactive to light.  Neck:     Thyroid: No thyromegaly.     Vascular: No carotid bruit or JVD.     Trachea: Trachea normal.  Cardiovascular:     Rate and Rhythm: Normal rate and regular rhythm.     Heart sounds: Normal heart sounds, S1 normal and S2 normal. No murmur heard.   No gallop.  Pulmonary:     Effort: Pulmonary  effort is normal. No accessory muscle usage or respiratory distress.     Breath sounds: Normal breath sounds.  Abdominal:     General: Bowel sounds are normal.     Palpations: Abdomen is soft.  Genitourinary:    Penis: Normal.      Testes: Normal.     Epididymis:     Right: Normal.     Left: Normal.    Musculoskeletal:        General: Normal range of motion.     Cervical back: Normal range of motion and neck supple.     Right lower leg: No edema.     Left lower leg: No edema.  Skin:    General: Skin is warm and dry.     Capillary Refill: Capillary refill takes less than 2 seconds.     Findings: No rash.  Neurological:     Mental Status: He is alert and oriented to person, place, and time.     Deep Tendon Reflexes: Reflexes are normal and symmetric.   Psychiatric:        Attention and Perception: Attention normal.        Mood and Affect: Mood normal.        Speech: Speech normal.        Behavior: Behavior normal. Behavior is cooperative.        Thought Content: Thought content normal.    Results for orders placed or performed in visit on 08/31/19  Microscopic Examination   URINE  Result Value Ref Range   WBC, UA None seen 0 - 5 /hpf   RBC 0-2 0 - 2 /hpf   Epithelial Cells (non renal) 0-10 0 - 10 /hpf   Bacteria, UA None seen None seen/Few  HIV Antibody (routine testing w rflx)  Result Value Ref Range   HIV Screen 4th Generation wRfx Non Reactive Non Reactive  CBC with Differential/Platelet  Result Value Ref Range   WBC 7.2 3.4 - 10.8 x10E3/uL   RBC 5.24 4.14 - 5.80 x10E6/uL   Hemoglobin 15.3 13.0 - 17.7 g/dL   Hematocrit 77.9 39.0 - 51.0 %   MCV 88 79 - 97 fL   MCH 29.2 26.6 - 33.0 pg   MCHC 33.0 31.5 - 35.7 g/dL   RDW 30.0 92.3 - 30.0 %   Platelets 239 150 - 450 x10E3/uL   Neutrophils 58 Not Estab. %   Lymphs 30 Not Estab. %   Monocytes 7 Not Estab. %   Eos 4 Not Estab. %   Basos 1 Not Estab. %   Neutrophils Absolute 4.2 1.4 - 7.0 x10E3/uL   Lymphocytes Absolute 2.2 0.7 - 3.1 x10E3/uL   Monocytes Absolute 0.5 0.1 - 0.9 x10E3/uL   EOS (ABSOLUTE) 0.3 0.0 - 0.4 x10E3/uL   Basophils Absolute 0.1 0.0 - 0.2 x10E3/uL   Immature Granulocytes 0 Not Estab. %   Immature Grans (Abs) 0.0 0.0 - 0.1 x10E3/uL  Comprehensive metabolic panel  Result Value Ref Range   Glucose 82 65 - 99 mg/dL   BUN 11 6 - 20 mg/dL   Creatinine, Ser 7.62 0.76 - 1.27 mg/dL   GFR calc non Af Amer 117 >59 mL/min/1.73   GFR calc Af Amer 135 >59 mL/min/1.73   BUN/Creatinine Ratio 13 9 - 20   Sodium 138 134 - 144 mmol/L   Potassium 4.3 3.5 - 5.2 mmol/L   Chloride 101 96 - 106 mmol/L   CO2 21 20 - 29 mmol/L   Calcium 9.5 8.7 -  10.2 mg/dL   Total Protein 7.4 6.0 - 8.5 g/dL   Albumin 4.6 4.1 - 5.2 g/dL   Globulin, Total 2.8 1.5 - 4.5 g/dL    Albumin/Globulin Ratio 1.6 1.2 - 2.2   Bilirubin Total 0.4 0.0 - 1.2 mg/dL   Alkaline Phosphatase 95 48 - 121 IU/L   AST 28 0 - 40 IU/L   ALT 34 0 - 44 IU/L  Lipid Panel w/o Chol/HDL Ratio  Result Value Ref Range   Cholesterol, Total 186 100 - 199 mg/dL   Triglycerides 300 (H) 0 - 149 mg/dL   HDL 32 (L) >92 mg/dL   VLDL Cholesterol Cal 31 5 - 40 mg/dL   LDL Chol Calc (NIH) 330 (H) 0 - 99 mg/dL  TSH  Result Value Ref Range   TSH 0.912 0.450 - 4.500 uIU/mL  UA/M w/rflx Culture, Routine   Specimen: Urine   URINE  Result Value Ref Range   Specific Gravity, UA 1.020 1.005 - 1.030   pH, UA 6.5 5.0 - 7.5   Color, UA Yellow Yellow   Appearance Ur Clear Clear   Leukocytes,UA Negative Negative   Protein,UA Negative Negative/Trace   Glucose, UA Negative Negative   Ketones, UA Negative Negative   RBC, UA Trace (A) Negative   Bilirubin, UA Negative Negative   Urobilinogen, Ur 0.2 0.2 - 1.0 mg/dL   Nitrite, UA Negative Negative   Microscopic Examination See below:       Assessment & Plan:   Problem List Items Addressed This Visit       Other   Possible exposure to STD    STD screening today to include RPR, HIV, HSV testing, and GC/Chlam.         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  Will send in Valtrex 1000 MG BID x 10 days, does have history of positive blood testing and exposure to ex who was positive.  Return as needed.         Follow up plan: Return if symptoms worsen or fail to improve.

## 2020-10-18 NOTE — Assessment & Plan Note (Signed)
STD screening today to include RPR, HIV, HSV testing, and GC/Chlam.

## 2020-10-21 LAB — GC/CHLAMYDIA PROBE AMP
Chlamydia trachomatis, NAA: NEGATIVE
Neisseria Gonorrhoeae by PCR: NEGATIVE

## 2020-10-22 ENCOUNTER — Telehealth: Payer: Self-pay

## 2020-10-22 ENCOUNTER — Other Ambulatory Visit: Payer: Self-pay | Admitting: Nurse Practitioner

## 2020-10-22 LAB — HIV ANTIBODY (ROUTINE TESTING W REFLEX): HIV Screen 4th Generation wRfx: NONREACTIVE

## 2020-10-22 LAB — RPR: RPR Ser Ql: NONREACTIVE

## 2020-10-22 LAB — HSV(HERPES SIMPLEX VRS) I + II AB-IGG
HSV 1 Glycoprotein G Ab, IgG: 7.96 index — ABNORMAL HIGH (ref 0.00–0.90)
HSV 2 IgG, Type Spec: 1.92 index — ABNORMAL HIGH (ref 0.00–0.90)

## 2020-10-22 LAB — HSV-2 IGG SUPPLEMENTAL TEST: HSV-2 IgG Supplemental Test: POSITIVE — AB

## 2020-10-22 MED ORDER — VALACYCLOVIR HCL 1 G PO TABS: 1000.0000 mg | ORAL_TABLET | Freq: Every day | ORAL | 4 refills | Status: DC

## 2020-10-22 NOTE — Telephone Encounter (Signed)
Copied from CRM [id number]. Topic: General - Other >> Oct 22, 2020  4:35 PM Pawlus, Maxine Glenn A wrote: Reason for CRM: Pt had some follow up questions regarding his latest lab results and wanted to further discuss.

## 2020-10-22 NOTE — Progress Notes (Signed)
Please let Phillips know his labs have returned, all STD testing returned negative with exception of herpes 1 and 2.  Both of these are positive -- meaning he has exposure and is at risk for outbreaks.  Herpes 1 is the oral herpes, or cold sores, and herpes 2 is the genital herpes.  At this time complete current treatment and if ongoing issues please let us know.  Have a great afternoon. Keep being awesome!!  Thank you for allowing me to participate in your care.  I appreciate you. Kindest regards, Meilani Edmundson

## 2021-01-02 ENCOUNTER — Emergency Department (EMERGENCY_DEPARTMENT_HOSPITAL)
Admission: EM | Admit: 2021-01-02 | Discharge: 2021-01-02 | Disposition: A | Discharge status: Other Acute Inpatient Hospital | Payer: BC Managed Care – PPO | Source: Home / Self Care | Attending: Emergency Medicine | Admitting: Emergency Medicine

## 2021-01-02 ENCOUNTER — Inpatient Hospital Stay
Admission: AD | Admit: 2021-01-02 | Discharge: 2021-01-06 | DRG: 885 | Disposition: A | Discharge status: Home / Self Care | Payer: BC Managed Care – PPO | Source: Intra-hospital | Attending: Behavioral Health | Admitting: Behavioral Health

## 2021-01-02 ENCOUNTER — Other Ambulatory Visit: Payer: Self-pay

## 2021-01-02 DIAGNOSIS — F4310 Post-traumatic stress disorder, unspecified: Secondary | ICD-10-CM | POA: Diagnosis present

## 2021-01-02 DIAGNOSIS — F411 Generalized anxiety disorder: Secondary | ICD-10-CM | POA: Diagnosis present

## 2021-01-02 DIAGNOSIS — Z62810 Personal history of physical and sexual abuse in childhood: Secondary | ICD-10-CM | POA: Diagnosis present

## 2021-01-02 DIAGNOSIS — Z1339 Encounter for screening examination for other mental health and behavioral disorders: Secondary | ICD-10-CM | POA: Insufficient documentation

## 2021-01-02 DIAGNOSIS — F429 Obsessive-compulsive disorder, unspecified: Secondary | ICD-10-CM | POA: Diagnosis present

## 2021-01-02 DIAGNOSIS — Z87891 Personal history of nicotine dependence: Secondary | ICD-10-CM

## 2021-01-02 DIAGNOSIS — R45851 Suicidal ideations: Secondary | ICD-10-CM | POA: Insufficient documentation

## 2021-01-02 DIAGNOSIS — G4700 Insomnia, unspecified: Secondary | ICD-10-CM | POA: Diagnosis present

## 2021-01-02 DIAGNOSIS — F333 Major depressive disorder, recurrent, severe with psychotic symptoms: Secondary | ICD-10-CM | POA: Diagnosis present

## 2021-01-02 DIAGNOSIS — F323 Major depressive disorder, single episode, severe with psychotic features: Secondary | ICD-10-CM | POA: Diagnosis not present

## 2021-01-02 DIAGNOSIS — F332 Major depressive disorder, recurrent severe without psychotic features: Secondary | ICD-10-CM | POA: Diagnosis present

## 2021-01-02 DIAGNOSIS — F329 Major depressive disorder, single episode, unspecified: Secondary | ICD-10-CM | POA: Insufficient documentation

## 2021-01-02 DIAGNOSIS — Z20822 Contact with and (suspected) exposure to covid-19: Secondary | ICD-10-CM | POA: Diagnosis present

## 2021-01-02 LAB — URINE DRUG SCREEN, QUALITATIVE (ARMC ONLY)
Amphetamines, Ur Screen: NOT DETECTED
Barbiturates, Ur Screen: NOT DETECTED
Benzodiazepine, Ur Scrn: NOT DETECTED
Cannabinoid 50 Ng, Ur ~~LOC~~: NOT DETECTED
Cocaine Metabolite,Ur ~~LOC~~: NOT DETECTED
MDMA (Ecstasy)Ur Screen: NOT DETECTED
Methadone Scn, Ur: NOT DETECTED
Opiate, Ur Screen: NOT DETECTED
Phencyclidine (PCP) Ur S: NOT DETECTED
Tricyclic, Ur Screen: NOT DETECTED

## 2021-01-02 LAB — ACETAMINOPHEN LEVEL: Acetaminophen (Tylenol), Serum: <10 ug/mL — ABNORMAL LOW (ref 10–30)

## 2021-01-02 LAB — COMPREHENSIVE METABOLIC PANEL
ALT: 48 U/L — ABNORMAL HIGH (ref 0–44)
AST: 27 U/L (ref 15–41)
Albumin: 4.6 g/dL (ref 3.5–5.0)
Alkaline Phosphatase: 72 U/L (ref 38–126)
Anion gap: 10 (ref 5–15)
BUN: 12 mg/dL (ref 6–20)
CO2: 24 mmol/L (ref 22–32)
Calcium: 9.5 mg/dL (ref 8.9–10.3)
Chloride: 103 mmol/L (ref 98–111)
Creatinine, Ser: 0.94 mg/dL (ref 0.61–1.24)
GFR, Estimated: >60 mL/min (ref >60)
Glucose, Bld: 106 mg/dL — ABNORMAL HIGH (ref 70–99)
Potassium: 3.9 mmol/L (ref 3.5–5.1)
Sodium: 137 mmol/L (ref 135–145)
Total Bilirubin: 0.8 mg/dL (ref 0.3–1.2)
Total Protein: 7.9 g/dL (ref 6.5–8.1)

## 2021-01-02 LAB — CBC
HCT: 46.3 % (ref 39.0–52.0)
Hemoglobin: 16.3 g/dL (ref 13.0–17.0)
MCH: 30.6 pg (ref 26.0–34.0)
MCHC: 35.2 g/dL (ref 30.0–36.0)
MCV: 87 fL (ref 80.0–100.0)
Platelets: 259 10*3/uL (ref 150–400)
RBC: 5.32 MIL/uL (ref 4.22–5.81)
RDW: 11.9 % (ref 11.5–15.5)
WBC: 9.5 10*3/uL (ref 4.0–10.5)
nRBC: 0 % (ref 0.0–0.2)

## 2021-01-02 LAB — ETHANOL: Alcohol, Ethyl (B): <10 mg/dL (ref <10)

## 2021-01-02 LAB — RESP PANEL BY RT-PCR (FLU A&B, COVID) ARPGX2
Influenza A by PCR: NEGATIVE
Influenza B by PCR: NEGATIVE
SARS Coronavirus 2 by RT PCR: NEGATIVE

## 2021-01-02 LAB — SALICYLATE LEVEL: Salicylate Lvl: <7 mg/dL — ABNORMAL LOW (ref 7.0–30.0)

## 2021-01-02 MED ORDER — ALUM & MAG HYDROXIDE-SIMETH 200-200-20 MG/5ML PO SUSP: 30.0000 mL | ORAL | Status: DC | PRN

## 2021-01-02 MED ORDER — TRAZODONE HCL 100 MG PO TABS
100.0000 mg | ORAL_TABLET | Freq: Every evening | ORAL | Status: DC | PRN
  Administered 2021-01-03 – 2021-01-05 (×3): 100 mg via ORAL
  Filled 2021-01-02 (×3): qty 1

## 2021-01-02 MED ORDER — HYDROXYZINE HCL 50 MG PO TABS
50.0000 mg | ORAL_TABLET | Freq: Four times a day (QID) | ORAL | Status: DC | PRN
  Administered 2021-01-03 – 2021-01-06 (×6): 50 mg via ORAL
  Filled 2021-01-02 (×7): qty 1

## 2021-01-02 MED ORDER — MAGNESIUM HYDROXIDE 400 MG/5ML PO SUSP: 30.0000 mL | Freq: Every day | ORAL | Status: DC | PRN

## 2021-01-02 MED ORDER — ACETAMINOPHEN 325 MG PO TABS: 650.0000 mg | ORAL_TABLET | Freq: Four times a day (QID) | ORAL | Status: DC | PRN

## 2021-01-02 NOTE — ED Notes (Signed)
Psychiatrist with patient.

## 2021-01-02 NOTE — ED Triage Notes (Signed)
Pt comes pov with SI for about a week. Pt states lots of stress. States that he was thinking about going to get a gun but does not currently have one. Has a pastor that is supportive and dad is also supportive. Currently is going to therapy but is not on any meds at this time. Calm, cooperative, voluntary.

## 2021-01-02 NOTE — ED Notes (Signed)
Pt lying in bed with eyes closed; resps even and unlabored. Pt easily aroused when name called. Pt states "I'm just really sleepy right not." Pt denies pain and SI/HI/AVH at this time. Pt states that his sleep is "not good" but he has slept more since he has been here; he says that he has trouble falling asleep and staying asleep. Pt describes his appetite as "poor". Pt denies anxiety but reports feelings of depression, which he rates 8/10, due to "stress". No acute distress noted.

## 2021-01-02 NOTE — ED Provider Notes (Signed)
[facility] Emergency Department Provider Note  ____________________________________________   Event Date/Time   First MD Initiated Contact with Patient 01/02/21 1430     (approximate)  I have reviewed the triage vital signs and the nursing notes.   HISTORY  Chief Complaint Psychiatric Evaluation    HPI William Reid is a 30 y.o. male with history of seizures who comes in with concern for SI for about a week.  Patient reports having a plan to get a gun but he does not currently have 1.  He reports writing a note.  His thoughts of been constant, nothing makes it better or worse.  Denies any other medical concerns          Past Medical History:  Diagnosis Date   Seizures [facility])     Patient Active Problem List   Diagnosis Date Noted   Possible exposure to STD 10/18/2020   Exposure to herpes simplex virus (HSV) 10/18/2020   Anxiety and depression 08/20/2019    History reviewed. No pertinent surgical history.  Prior to Admission medications   Medication Sig Start Date End Date Taking? Authorizing Provider  valACYclovir (VALTREX) 1000 MG tablet Take 1 tablet (1,000 mg total) by mouth daily. 10/22/20   Marjie Skiff, NP    Allergies Patient has no known allergies.  Family History  Problem Relation Age of Onset   Epilepsy Father    Hypertension Father     Social History Social History   Tobacco Use   Smoking status: Former   Smokeless tobacco: Never  Building services engineer Use: Never used  Substance Use Topics   Alcohol use: Yes    Comment: occasionally   Drug use: Yes    Types: Marijuana      Review of Systems Constitutional: No fever/chills Eyes: No visual changes. ENT: No sore throat. Cardiovascular: Denies chest pain. Respiratory: Denies shortness of breath. Gastrointestinal: No abdominal pain.  No nausea, no vomiting.  No diarrhea.  No constipation. Genitourinary: Negative for dysuria. Musculoskeletal: Negative for  back pain. Skin: Negative for rash. Neurological: Negative for headaches, focal weakness or numbness. Psych: SI All other ROS negative ____________________________________________   PHYSICAL EXAM:  VITAL SIGNS: ED Triage Vitals  Enc Vitals Group     BP 01/02/21 1359 132/81     Pulse Rate 01/02/21 1359 81     Resp 01/02/21 1359 14     Temp 01/02/21 1359 98.8 F (37.1 C)     Temp Source 01/02/21 1359 Oral     SpO2 01/02/21 1359 97 %     Weight 01/02/21 1401 (!) 350 lb (158.8 kg)     Height 01/02/21 1401 6\' 2"  (1.88 m)     Head Circumference --      Peak Flow --      Pain Score 01/02/21 1401 0     Pain Loc --      Pain Edu? --      Excl. in GC? --     Constitutional: Alert and oriented. Well appearing and in no acute distress. Eyes: Conjunctivae are normal. No swelling around eyes Head: Atraumatic. Nose: No congestion/rhinnorhea. Mouth/Throat: Mucous membranes are moist.   Neck: No stridor. Trachea Midline. FROM Cardiovascular: Normal rate, no swelling noted Respiratory: No increased wob, no stridor Gastrointestinal: Soft and nontender. No distention. No abdominal bruits.  Musculoskeletal: No lower extremity tenderness nor edema.  No joint effusions. Neurologic:  Normal speech and language. No gross focal neurologic deficits are appreciated.  Skin:  Skin is warm, dry and intact. No rash noted. Psychiatric: Positive SI, reports some voices telling him to kill himself GU: Deferred   ____________________________________________   LABS (all labs ordered are listed, but only abnormal results are displayed)  Labs Reviewed  COMPREHENSIVE METABOLIC PANEL - Abnormal; Notable for the following components:      Result Value   Glucose, Bld 106 (*)    ALT 48 (*)    All other components within normal limits  CBC  ETHANOL  SALICYLATE LEVEL  ACETAMINOPHEN LEVEL  URINE DRUG SCREEN, QUALITATIVE ([facility] ONLY)   ____________________________________________   INITIAL IMPRESSION  / ASSESSMENT AND PLAN / ED COURSE  William Reid was evaluated in Emergency Department on 01/02/2021 for the symptoms described in the history of present illness. He was evaluated in the context of the global COVID-19 pandemic, which necessitated consideration that the patient might be at risk for infection with the SARS-CoV-2 virus that causes COVID-19. Institutional protocols and algorithms that pertain to the evaluation of patients at risk for COVID-19 are in a state of rapid change based on information released by regulatory bodies including the CDC and federal and state organizations. These policies and algorithms were followed during the patient's care in the ED.    Pt is without any acute medical complaints. No exam findings to suggest medical cause of current presentation. Will order psychiatric screening labs and discuss further w/ psychiatric service.  D/d includes but is not limited to psychiatric disease, behavioral/personality disorder, inadequate socioeconomic support, medical.  Based on HPI, exam, unremarkable labs, no concern for acute medical problem at this time. No rigidity, clonus, hyperthermia, focal neurologic deficit, diaphoresis, tachycardia, meningismus, ataxia, gait abnormality or other finding to suggest this visit represents a non-psychiatric problem. Screening labs reviewed.    Given this, pt medically cleared, to be dispositioned per Psych.    The patient has been placed in psychiatric observation due to the need to provide a safe environment for the patient while obtaining psychiatric consultation and evaluation, as well as ongoing medical and medication management to treat the patient's condition.  The patient has not been placed under full IVC at this time.        ____________________________________________   FINAL CLINICAL IMPRESSION(S) / ED DIAGNOSES   Final diagnoses:  Suicide ideation      MEDICATIONS GIVEN DURING THIS VISIT:  Medications - No  data to display   ED Discharge Orders     None        Note:  This document was prepared using Dragon voice recognition software and may include unintentional dictation errors.    Concha Se, MD 01/02/21 1435

## 2021-01-02 NOTE — ED Notes (Signed)
Assumed care; pt report received from Katie, RN. 

## 2021-01-02 NOTE — ED Notes (Signed)
Pt belongings include two flip flops, two socks, one pair black shorts, one pair underwear, one tshirt, one black sweatshirt, one black hat.   Pt's dad (todd) taking pt's wallet, phone, and headphones with him.
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
## 2021-01-02 NOTE — ED Notes (Signed)
Hospital meal provided.  100% consumed, pt tolerated w/o complaints.  Waste discarded appropriately.   
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
## 2021-01-03 DIAGNOSIS — F411 Generalized anxiety disorder: Secondary | ICD-10-CM | POA: Diagnosis present

## 2021-01-03 DIAGNOSIS — F429 Obsessive-compulsive disorder, unspecified: Secondary | ICD-10-CM | POA: Diagnosis present

## 2021-01-03 DIAGNOSIS — E669 Obesity, unspecified: Secondary | ICD-10-CM | POA: Insufficient documentation

## 2021-01-03 DIAGNOSIS — F332 Major depressive disorder, recurrent severe without psychotic features: Secondary | ICD-10-CM | POA: Diagnosis present

## 2021-01-03 MED ORDER — ESCITALOPRAM OXALATE 10 MG PO TABS
10.0000 mg | ORAL_TABLET | Freq: Every day | ORAL | Status: DC
  Administered 2021-01-03 – 2021-01-05 (×3): 10 mg via ORAL
  Filled 2021-01-03 (×3): qty 1
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
## 2021-01-03 NOTE — Group Note (Signed)
[facility] LCSW Group Therapy Note   Group Date: 01/03/2021 Start Time: 1315 End Time: 1405  Type of Therapy and Topic:  Group Therapy:  Feelings around Relapse and Recovery  Participation Level:  Active    Description of Group:    Patients in this group will discuss emotions they experience before and after a relapse. They will process how experiencing these feelings, or avoidance of experiencing them, relates to having a relapse. Facilitator will guide patients to explore emotions they have related to recovery. Patients will be encouraged to process which emotions are more powerful. They will be guided to discuss the emotional reaction significant others in their lives may have to patients' relapse or recovery. Patients will be assisted in exploring ways to respond to the emotions of others without this contributing to a relapse.  Therapeutic Goals: Patient will identify two or more emotions that lead to relapse for them:  Patient will identify two emotions that result when they relapse:  Patient will identify two emotions related to recovery:  Patient will demonstrate ability to communicate their needs through discussion and/or role plays.   Summary of Patient Progress:  Patient was present for the entirety of the group session. Patient was an active listener and participated in the topic of discussion, provided helpful advice to others, and added nuance to topic of conversation. He stated that taking things "face value" and not "chasing the rabbit" are helpful strategies he wants to rely on so that he does not feel anxious about his intrusive thoughts. He also stated that he play the guitar and goes outside to alleviate his symptoms. He stated that having his 3 dogs is also helpful to him, because he can wake up and know that they are there; though at times he said they can also be a stressor.    Therapeutic Modalities:   Cognitive Behavioral Therapy Solution-Focused Therapy Assertiveness  Training Relapse Prevention Therapy   Damariz Paganelli A [place], LCSWA

## 2021-01-03 NOTE — Progress Notes (Signed)
Recreation Therapy Notes   Date: 01/03/2021  Time: 10:30 am   Location: Craft room    Behavioral response: N/A   Intervention Topic: Self-care  Discussion/Intervention: Patient did not attend group.   Clinical Observations/Feedback:  Patient did not attend group.   Dorothey Oetken LRT/CTRS        Marlana Mckowen 01/03/2021 12:52 PM

## 2021-01-03 NOTE — [facility] Suicide Risk Assessment (Signed)
[facility] INPATIENT:  Family/Significant Other Suicide Prevention Education  Suicide Prevention Education:  Education Completed; William Reid, father, [phone] has been identified by the patient as the family member/significant other with whom the patient will be residing, and identified as the person(s) who will aid the patient in the event of a mental health crisis (suicidal ideations/suicide attempt).  With written consent from the patient, the family member/significant other has been provided the following suicide prevention education, prior to the and/or following the discharge of the patient.  The suicide prevention education provided includes the following: Suicide risk factors Suicide prevention and interventions National Suicide Hotline telephone number [facility] assessment telephone number [facility] Emergency Assistance 911 [facility] and/or Residential Mobile Crisis Unit telephone number  Request made of family/significant other to: Remove weapons (e.g., guns, rifles, knives), all items previously/currently identified as safety concern.   Remove drugs/medications (over-the-counter, prescriptions, illicit drugs), all items previously/currently identified as a safety concern.  The family member/significant other verbalizes understanding of the suicide prevention education information provided.  The family member/significant other agrees to remove the items of safety concern listed above.  Father reports that "he was having thoughts about hurting himself".  Father reports that "when he was masturbating he would have thoughts of children just for a second and he would stop, after a while his erection would go down". Father reports that "I am proud of him for doing what he did".  Father denies pt is danger to self or others.  Father reports that "this is tough question to answer".  Father reports that patient does not have access to weapons in the home.   Father  William Reid 01/03/2021, 12:55 PM

## 2021-01-03 NOTE — Progress Notes (Signed)
Call returned to William Reid, father, [phone]. Update on plan of care provided and all questions answered. He reiterated information in HPI.
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
## 2021-01-03 NOTE — Progress Notes (Signed)
Recreation Therapy Notes  INPATIENT RECREATION TR PLAN  Patient Details Name: KORBY RATAY MRN: [id number] DOB: 1990-07-22 Today's Date: 01/03/2021  Rec Therapy Plan Is patient appropriate for Therapeutic Recreation?: Yes Treatment times per week: at least 3 Estimated Length of Stay: 5-7 days TR Treatment/Interventions: Group participation (Comment)  Discharge Criteria Pt will be discharged from therapy if:: Discharged Treatment plan/goals/alternatives discussed and agreed upon by:: Patient/family  Discharge Summary     Markisha Meding 01/03/2021, 2:49 PM
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
## 2021-01-03 NOTE — Plan of Care (Signed)
D: Patient up and present on milieu. Eating meals and talking on the phone. Denies SI/HI/AH/VH and pain. Denies pain. Rates depression and anxiety 9/10. States, "I'm going to need something for this anxiety." I can feel my teeth chattering and I'm tapping my fingers.  When asked, patient informed RN he is feeling anxious and depressed about past thoughts and feelings. Takes medication as prescribed.   A: Labs and vital signs monitored. Patient supported emotionally and encouraged to verbalize concerns. Patient verbalized feeling extremely anxious and   R:  No adverse reaction to medication noted. Cont Q 15 minute check for safety.  Problem: Education: Goal: Knowledge of [facility] Education information/materials will improve Outcome: Progressing Goal: Emotional status will improve Outcome: Progressing Goal: Mental status will improve Outcome: Progressing Goal: Verbalization of understanding the information provided will improve Outcome: Progressing   Problem: Safety: Goal: Periods of time without injury will increase Outcome: Progressing   Problem: Education: Goal: Ability to make informed decisions regarding treatment will improve Outcome: Progressing   Problem: Medication: Goal: Compliance with prescribed medication regimen will improve Outcome: Progressing

## 2021-01-03 NOTE — BH IP Treatment Plan (Signed)
Interdisciplinary Treatment and Diagnostic Plan Update  01/03/2021 Time of Session: 9:00 AM William Reid MRN: [id number]  Principal Diagnosis: <principal problem not specified>  Secondary Diagnoses: Active Problems:   Depression, major, recurrent, severe with psychosis ([place])   Current Medications:  Current Facility-Administered Medications  Medication Dose Route Frequency Provider Last Rate Last Admin   acetaminophen (TYLENOL) tablet 650 mg  650 mg Oral Q6H PRN Clapacs, Madie Reno, MD       alum & mag hydroxide-simeth (MAALOX/MYLANTA) 200-200-20 MG/5ML suspension 30 mL  30 mL Oral Q4H PRN Clapacs, Madie Reno, MD       hydrOXYzine (ATARAX/VISTARIL) tablet 50 mg  50 mg Oral Q6H PRN Clapacs, Madie Reno, MD   50 mg at 01/03/21 0831   magnesium hydroxide (MILK OF MAGNESIA) suspension 30 mL  30 mL Oral Daily PRN Clapacs, Madie Reno, MD       traZODone (DESYREL) tablet 100 mg  100 mg Oral QHS PRN Clapacs, Madie Reno, MD       PTA Medications: Medications Prior to Admission  Medication Sig Dispense Refill Last Dose   valACYclovir (VALTREX) 1000 MG tablet Take 1 tablet (1,000 mg total) by mouth daily. (Patient not taking: No sig reported) 90 tablet 4     Patient Stressors:    Patient Strengths:    Treatment Modalities: Medication Management, Group therapy, Case management,  1 to 1 session with clinician, Psychoeducation, Recreational therapy.   Physician Treatment Plan for Primary Diagnosis: <principal problem not specified> Long Term Goal(s):     Short Term Goals:    Medication Management: Evaluate patient's response, side effects, and tolerance of medication regimen.  Therapeutic Interventions: 1 to 1 sessions, Unit Group sessions and Medication administration.  Evaluation of Outcomes: Not Met  Physician Treatment Plan for Secondary Diagnosis: Active Problems:   Depression, major, recurrent, severe with psychosis ([place])  Long Term Goal(s):     Short Term Goals:       Medication Management:  Evaluate patient's response, side effects, and tolerance of medication regimen.  Therapeutic Interventions: 1 to 1 sessions, Unit Group sessions and Medication administration.  Evaluation of Outcomes: Not Met   RN Treatment Plan for Primary Diagnosis: <principal problem not specified> Long Term Goal(s): Knowledge of disease and therapeutic regimen to maintain health will improve  Short Term Goals: Ability to remain free from injury will improve, Ability to verbalize frustration and anger appropriately will improve, Ability to demonstrate self-control, Ability to participate in decision making will improve, Ability to verbalize feelings will improve, Ability to disclose and discuss suicidal ideas, Ability to identify and develop effective coping behaviors will improve, and Compliance with prescribed medications will improve  Medication Management: RN will administer medications as ordered by provider, will assess and evaluate patient's response and provide education to patient for prescribed medication. RN will report any adverse and/or side effects to prescribing provider.  Therapeutic Interventions: 1 on 1 counseling sessions, Psychoeducation, Medication administration, Evaluate responses to treatment, Monitor vital signs and CBGs as ordered, Perform/monitor CIWA, COWS, AIMS and Fall Risk screenings as ordered, Perform wound care treatments as ordered.  Evaluation of Outcomes: Not Met   LCSW Treatment Plan for Primary Diagnosis: <principal problem not specified> Long Term Goal(s): Safe transition to appropriate next level of care at discharge, Engage patient in therapeutic group addressing interpersonal concerns.  Short Term Goals: Engage patient in aftercare planning with referrals and resources, Increase social support, Increase ability to appropriately verbalize feelings, Increase emotional regulation, Facilitate acceptance of mental health  diagnosis and concerns, and Increase skills for  wellness and recovery  Therapeutic Interventions: Assess for all discharge needs, 1 to 1 time with Social worker, Explore available resources and support systems, Assess for adequacy in community support network, Educate family and significant other(s) on suicide prevention, Complete Psychosocial Assessment, Interpersonal group therapy.  Evaluation of Outcomes: Not Met   Progress in Treatment: Attending groups: No. Participating in groups: No. Taking medication as prescribed: Yes. Toleration medication: Yes. Family/Significant other contact made: No, will contact:  when given permission. Patient understands diagnosis: Yes. Discussing patient identified problems/goals with staff: Yes. Medical problems stabilized or resolved: Yes. Denies suicidal/homicidal ideation: No. Issues/concerns per patient self-inventory: No. Other: none.  New problem(s) identified: No, Describe:  none.  New Short Term/Long Term Goal(s): medication management for mood stabilization; elimination of SI thoughts; development of comprehensive mental wellness plan.  Patient Goals: "Getting better. Getting back to the old me, being happy."   Discharge Plan or Barriers: CSW will assist pt with development of an appropriate aftercare/discharge plan.  Reason for Continuation of Hospitalization: Depression Medication stabilization Suicidal ideation  Estimated Length of Stay: 1-7 days   Scribe for Treatment Team: Shirl Harris, LCSW 01/03/2021 10:14 AM

## 2021-01-03 NOTE — Progress Notes (Signed)
Recreation Therapy Notes  INPATIENT RECREATION THERAPY ASSESSMENT  Patient Details Name: William Reid MRN: [id number] DOB: November 10, 1990 Today's Date: 01/03/2021       Information Obtained From: Patient  Able to Participate in Assessment/Interview: Yes  Patient Presentation: Responsive  Reason for Admission (Per Patient): Active Symptoms  Patient Stressors:    Coping Skills:   Isolation, Avoidance, Talk, Music  Leisure Interests (2+):  Music - Listen, Music - Play instrument, Individual - TV  Frequency of Recreation/Participation: Monthly  Awareness of Community Resources:  No  Community Resources:     Current Use:    If no, Barriers?:    Expressed Interest in [employer] Information: No  [place]idence[place]  Patient Main Form of Transportation: Set designer  Patient Strengths:  Genuine  Patient Identified Areas of Improvement:  Be happy and confidnet  Patient Goal for Hospitalization:  Getting better  Current SI (including self-harm):  Yes (No plan)  Current HI:  No  Current AVH: Yes (Hearing things)  Staff Intervention Plan: Group Attendance, Collaborate with Interdisciplinary Treatment Team  Consent to Intern Participation: N/A  William Reid 01/03/2021, 2:44 PM
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
## 2021-01-04 ENCOUNTER — Encounter: Payer: Self-pay | Admitting: Psychiatry

## 2021-01-04 NOTE — Progress Notes (Signed)
Patient alert and oriented x 4, affect is flat but brightens upon approach, he denies SI/HI/AVH noted interacting appropriately with peers and staff and complaint with medication regimen. 15 minutes safety checks maintained will continue to monitor.
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
## 2021-01-04 NOTE — Group Note (Signed)
LCSW Group Therapy Note  Group Date: 01/04/2021 Start Time: 1305 End Time: 1405   Type of Therapy and Topic:  Group Therapy - Healthy vs Unhealthy Coping Skills  Participation Level:  Did Not Attend   Description of Group The focus of this group was to determine what unhealthy coping techniques typically are used by group members and what healthy coping techniques would be helpful in coping with various problems. Patients were guided in becoming aware of the differences between healthy and unhealthy coping techniques. Patients were asked to identify 2-3 healthy coping skills they would like to learn to use more effectively.  Therapeutic Goals Patients learned that coping is what human beings do all day long to deal with various situations in their lives Patients defined and discussed healthy vs unhealthy coping techniques Patients identified their preferred coping techniques and identified whether these were healthy or unhealthy Patients determined 2-3 healthy coping skills they would like to become more familiar with and use more often. Patients provided support and ideas to each other   Summary of Patient Progress:  Patient did not attend group despite encouraged participation.    Therapeutic Modalities Cognitive Behavioral Therapy Motivational Interviewing  Hasna Stefanik K Anastasya Jewell, LCSWA 01/04/2021  3:11 PM   

## 2021-01-04 NOTE — Progress Notes (Signed)
D: Pt appears as anxious and sullen. Pt denies SI HI and AVH at this time. Pt is observed interacting appropriately with others on the unit.  A: Administered anti-anxiety medication which was effective. Provided pt with emotional support. Encouraged pt to come to staff with any concerns.  R: Pt is calm and cooperative. Pt remains safe on the unit at this time.    01/04/21 0757  Psych Admission Type (Psych Patients Only)  Admission Status Involuntary  Psychosocial Assessment  Patient Complaints Anxiety  Eye Contact Fair  Facial Expression Sullen;Anxious  Affect Sullen  Speech Logical/coherent  Interaction Minimal  Motor Activity Slow  Appearance/Hygiene Unremarkable  Behavior Characteristics Cooperative;Anxious  Mood Sullen  Thought Process  Coherency WDL  Content WDL  Delusions None reported or observed  Perception WDL  Hallucination None reported or observed  Judgment Limited  Confusion None  Danger to Self  Current suicidal ideation? Denies  Self-Injurious Behavior No self-injurious ideation or behavior indicators observed or expressed   Agreement Not to Harm Self Yes  Description of Agreement verbal agreement  Danger to Others  Danger to Others None reported or observed

## 2021-01-05 NOTE — Progress Notes (Signed)
D: Patient appears as anxious. Pt denies SI, HI, and AVH at this time.   A: Administered anti-anxiety medication as per MAR orders.   R: Pt remains safe on the unit at this time.   01/05/21 0817  Psych Admission Type (Psych Patients Only)  Admission Status Involuntary  Psychosocial Assessment  Patient Complaints Anxiety  Eye Contact Fair  Facial Expression Sullen  Affect Anxious  Speech Slow  Interaction Minimal  Motor Activity Slow  Appearance/Hygiene Unremarkable  Behavior Characteristics Cooperative;Calm;Anxious  Mood Pleasant  Thought Process  Coherency WDL  Content WDL  Delusions None reported or observed  Perception WDL  Hallucination None reported or observed  Judgment Limited  Confusion None  Danger to Self  Current suicidal ideation? Denies  Self-Injurious Behavior No self-injurious ideation or behavior indicators observed or expressed   Agreement Not to Harm Self Yes  Description of Agreement verbal agreement  Danger to Others  Danger to Others None reported or observed

## 2021-01-05 NOTE — Progress Notes (Signed)
Patient alert and oriented x 4, affect is flat but brightens upon approach, he denies SI/HI/AVH isolates to self, forwards very little information, complaint with medication regimen. 15 minutes safety checks maintained will continue to monitor.

## 2021-01-05 NOTE — Group Note (Signed)
LCSW Group Therapy Note  Group Date: 01/05/2021 Start Time: 1300 End Time: 1400   Type of Therapy and Topic:  Group Therapy - How To Cope with Nervousness about Discharge   Participation Level:  Did Not Attend   Description of Group This process group involved identification of patients' feelings about discharge. Some of them are scheduled to be discharged soon, while others are new admissions, but each of them was asked to share thoughts and feelings surrounding discharge from the hospital. One common theme was that they are excited at the prospect of going home, while another was that many of them are apprehensive about sharing why they were hospitalized. Patients were given the opportunity to discuss these feelings with their peers in preparation for discharge.  Therapeutic Goals  Patient will identify their overall feelings about pending discharge. Patient will think about how they might proactively address issues that they believe will once again arise once they get home (i.e. with parents). Patients will participate in discussion about having hope for change.   Summary of Patient Progress: Patient did not attend group despite encouraged participation.   Therapeutic Modalities Cognitive Behavioral Therapy   William Reid, LCSWA 01/05/2021  3:20 PM

## 2021-01-05 NOTE — Progress Notes (Signed)
[facility] MD Progress Note  01/05/2021 12:45 PM William Reid  MRN:  [id number] Subjective: Follow-up patient who presented with depression and suicidal ideation.  Patient seen and chart reviewed.  Patient stays isolated a lot of the time.  Stays in bed reading.  He was awake and cooperative when I came to see him and told me he was feeling much better.  Denied any suicidal thoughts at all.  Denied any thoughts of self-harm.  Did not have much to elaborate on it though.  Once again asking about discharge planning.  Tolerating medication.  Blood pressure a little high but not previously treated. Principal Problem: MDD (major depressive disorder), recurrent severe, without psychosis (HCC) Diagnosis: Principal Problem:   MDD (major depressive disorder), recurrent severe, without psychosis (HCC) Active Problems:   PTSD (post-traumatic stress disorder)   OCD (obsessive compulsive disorder)   Generalized anxiety disorder  Total Time spent with patient: 30 minutes  Past Psychiatric History: Past history of depression and probable PTSD possible history of seizures  Past Medical History:  Past Medical History:  Diagnosis Date   Seizures (HCC)    History reviewed. No pertinent surgical history. Family History:  Family History  Problem Relation Age of Onset   Epilepsy Father    Hypertension Father    Family Psychiatric  History: See previous Social History:  Social History   Substance and Sexual Activity  Alcohol Use Yes   Comment: occasionally     Social History   Substance and Sexual Activity  Drug Use Yes   Types: Marijuana    Social History   Socioeconomic History   Marital status: Single    Spouse name: Not on file   Number of children: Not on file   Years of education: Not on file   Highest education level: Not on file  Occupational History   Not on file  Tobacco Use   Smoking status: Former   Smokeless tobacco: Never  Vaping Use   Vaping Use: Never used  Substance and  Sexual Activity   Alcohol use: Yes    Comment: occasionally   Drug use: Yes    Types: Marijuana   Sexual activity: Yes  Other Topics Concern   Not on file  Social History Narrative   Not on file   Social Determinants of Health   Financial Resource Strain: Not on file  Food Insecurity: Not on file  Transportation Needs: Not on file  Physical Activity: Not on file  Stress: Not on file  Social Connections: Not on file   Additional Social History:                         Sleep: Fair  Appetite:  Fair  Current Medications: Current Facility-Administered Medications  Medication Dose Route Frequency Provider Last Rate Last Admin   acetaminophen (TYLENOL) tablet 650 mg  650 mg Oral Q6H PRN Kjell Brannen T, MD       alum & mag hydroxide-simeth (MAALOX/MYLANTA) 200-200-20 MG/5ML suspension 30 mL  30 mL Oral Q4H PRN Maisley Hainsworth T, MD       escitalopram (LEXAPRO) tablet 10 mg  10 mg Oral QHS Jesse Sans, MD   10 mg at 01/04/21 2115   hydrOXYzine (ATARAX/VISTARIL) tablet 50 mg  50 mg Oral Q6H PRN Jeily Guthridge, Jackquline Denmark, MD   50 mg at 01/05/21 0815   magnesium hydroxide (MILK OF MAGNESIA) suspension 30 mL  30 mL Oral Daily PRN Niyah Mamaril, Jackquline Denmark, MD  traZODone (DESYREL) tablet 100 mg  100 mg Oral QHS PRN Dhyana Bastone, Jackquline Denmark, MD   100 mg at 01/04/21 2115    Lab Results: No results found for this or any previous visit (from the past 48 hour(s)).  Blood Alcohol level:  Lab Results  Component Value Date   ETH <10 01/02/2021    Metabolic Disorder Labs: No results found for: HGBA1C, MPG No results found for: PROLACTIN Lab Results  Component Value Date   CHOL 186 08/31/2019   TRIG 175 (H) 08/31/2019   HDL 32 (L) 08/31/2019   LDLCALC 123 (H) 08/31/2019    Physical Findings: AIMS:  , ,  ,  ,    CIWA:    COWS:     Musculoskeletal: Strength & Muscle Tone: within normal limits Gait & Station: normal Patient leans: N/A  Psychiatric Specialty Exam:  Presentation   General Appearance: Casual  Eye Contact:Good  Speech:Normal Rate  Speech Volume:Normal  Handedness:Right   Mood and Affect  Mood:Depressed; Dysphoric  Affect:Congruent; Tearful   Thought Process  Thought Processes:Goal Directed  Descriptions of Associations:Intact  Orientation:Full (Time, Place and Person)  Thought Content:Rumination  History of Schizophrenia/Schizoaffective disorder:No data recorded Duration of Psychotic Symptoms:No data recorded Hallucinations:No data recorded Ideas of Reference:None  Suicidal Thoughts:No data recorded Homicidal Thoughts:No data recorded  Sensorium  Memory:Immediate Good; Recent Good; Remote Good  Judgment:Intact  Insight:Fair   Executive Functions  Concentration:Fair  Attention Span:Fair  Recall:Fair  Fund of Knowledge:Fair  Language:Fair   Psychomotor Activity  Psychomotor Activity: No data recorded  Assets  Assets:Communication Skills; Desire for Improvement; Financial Resources/Insurance; Housing; Physical Health; Resilience; Social Support; Talents/Skills   Sleep  Sleep: No data recorded   Physical Exam: Physical Exam Vitals and nursing note reviewed.  Constitutional:      Appearance: Normal appearance.  HENT:     Head: Normocephalic and atraumatic.     Mouth/Throat:     Pharynx: Oropharynx is clear.  Eyes:     Pupils: Pupils are equal, round, and reactive to light.  Cardiovascular:     Rate and Rhythm: Normal rate and regular rhythm.  Pulmonary:     Effort: Pulmonary effort is normal.     Breath sounds: Normal breath sounds.  Abdominal:     General: Abdomen is flat.     Palpations: Abdomen is soft.  Musculoskeletal:        General: Normal range of motion.  Skin:    General: Skin is warm and dry.  Neurological:     General: No focal deficit present.     Mental Status: He is alert. Mental status is at baseline.  Psychiatric:        Mood and Affect: Mood normal.        Thought  Content: Thought content normal.   Review of Systems  Constitutional: Negative.   HENT: Negative.    Eyes: Negative.   Respiratory: Negative.    Cardiovascular: Negative.   Gastrointestinal: Negative.   Musculoskeletal: Negative.   Skin: Negative.   Neurological: Negative.   Psychiatric/Behavioral: Negative.    Blood pressure (!) 154/97, pulse 85, temperature 98.1 F (36.7 C), temperature source Oral, resp. rate 17, height 6\' 2"  (1.88 m), weight (!) 158.8 kg, SpO2 97 %. Body mass index is 44.94 kg/m.   Treatment Plan Summary: Plan encourage patient to be out of his room a little bit more go outdoors when possible and to interact with others.  No change to medicine.  Patient is to discuss discharge  planning with treatment team and staff tomorrow.  Mordecai Rasmussen, MD 01/05/2021, 12:45 PM

## 2021-01-06 MED ORDER — ESCITALOPRAM OXALATE 10 MG PO TABS: 10.0000 mg | ORAL_TABLET | Freq: Every day | ORAL | 1 refills | Status: DC

## 2021-01-06 NOTE — Plan of Care (Signed)
  Problem: Education: Goal: Knowledge of [facility] Education information/materials will improve Outcome: Adequate for Discharge Goal: Emotional status will improve Outcome: Adequate for Discharge Goal: Mental status will improve Outcome: Adequate for Discharge Goal: Verbalization of understanding the information provided will improve Outcome: Adequate for Discharge   Problem: Safety: Goal: Periods of time without injury will increase Outcome: Adequate for Discharge   Problem: Education: Goal: Ability to make informed decisions regarding treatment will improve Outcome: Adequate for Discharge   Problem: Medication: Goal: Compliance with prescribed medication regimen will improve Outcome: Adequate for Discharge

## 2021-01-06 NOTE — Progress Notes (Signed)
Patient denies SI/HI/AVH. Belongings were returned to patient. Discharge instructions  including medication and follow up information were reviewed with patient and understanding was verbalized. Patient was not observed to be in distress at time of discharge. Patient was escorted out with staff to the medical mall to be picked up by patients father.

## 2021-01-06 NOTE — Progress Notes (Signed)
  [facility] Adult Case Management Discharge Plan :  Will you be returning to the same living situation after discharge:  Yes,  pt reports that he is returning home.  At discharge, do you have transportation home?: Yes,  pt reports that father will provide transportation.  Do you have the ability to pay for your medications: Yes,  [employer]  Release of information consent forms completed and in the chart;  Patient's signature needed at discharge.  Patient to Follow up at:  Follow-up Information     [facility] Follow up.   Why: Your appointment is scheduled for 01/07/2021 at [facility].  This is a telehealth appointment.  You also have an appointment scheduled for 01/15/2021 at 4PM.  Thanks! Contact information: [address] [place], [place] [address] Phone: [phone] Fax: [phone]                Next level of care provider has access to [facility] Link:no  Safety Planning and Suicide Prevention discussed: Yes,  SPE completed with patient and father.      Has patient been referred to the Quitline?: Patient refused referral  Patient has been referred for addiction treatment: Pt. refused referral  KRISTAPHER DUBUQUE, LCSW 01/06/2021, 9:59 AM
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
## 2021-01-06 NOTE — Discharge Summary (Signed)
Physician Discharge Summary Note  Patient:  William Reid is an 30 y.o., male MRN:  [id number] DOB:  12/27/1990 Patient phone:  [phone] (home)  Patient address:   [address] [place] [address],  Total Time spent with patient: 35 minutes- 25 minutes face-to-face contact with patient, 10 minutes documentation, coordination of care, scripts   Date of Admission:  01/02/2021 Date of Discharge: 01/06/2021  Reason for Admission:  30 year old male presenting for worsening depression, anxiety, and suicidal ideations with a plan to shoot himself with gun.   Principal Problem: MDD (major depressive disorder), recurrent severe, without psychosis (HCC) Discharge Diagnoses: Principal Problem:   MDD (major depressive disorder), recurrent severe, without psychosis (HCC) Active Problems:   PTSD (post-traumatic stress disorder)   OCD (obsessive compulsive disorder)   Generalized anxiety disorder   Past Psychiatric History:  No previous hospitalizations.  He has had past episodes of serious suicidal thoughts with plans of overdosing or shooting himself in the past. No attempts.  Patient was prescribed Effexor by primary care treatment team last year.  He says that he stopped the medicine when he started seeing a therapist.  Past Medical History:  Past Medical History:  Diagnosis Date   Seizures (HCC)    History reviewed. No pertinent surgical history. Family History:  Family History  Problem Relation Age of Onset   Epilepsy Father    Hypertension Father    Family Psychiatric  History: He reports that his father has told him that he tried to kill himself twice. Social History:  Social History   Substance and Sexual Activity  Alcohol Use Yes   Comment: occasionally     Social History   Substance and Sexual Activity  Drug Use Yes   Types: Marijuana    Social History   Socioeconomic History   Marital status: Single    Spouse name: Not on file   Number of  children: Not on file   Years of education: Not on file   Highest education level: Not on file  Occupational History   Not on file  Tobacco Use   Smoking status: Former   Smokeless tobacco: Never  Vaping Use   Vaping Use: Never used  Substance and Sexual Activity   Alcohol use: Yes    Comment: occasionally   Drug use: Yes    Types: Marijuana   Sexual activity: Yes  Other Topics Concern   Not on file  Social History Narrative   Not on file   Social Determinants of Health   Financial Resource Strain: Not on file  Food Insecurity: Not on file  Transportation Needs: Not on file  Physical Activity: Not on file  Stress: Not on file  Social Connections: Not on file    Hospital Course:  30 year old male presenting for worsening depression, anxiety, and suicidal ideations with a plan to shoot himself with gun. Patient started on Lexapro 10 mg QHS for MDD, GAD, OCD, and PTSD. He tolerated medication well. He participated in groups and interacted well with staff and peers. Mood gradually improved, and he now denies suicidal ideations, homicidal ideations, visual hallucinations, and auditory hallucinations. He plans to return home and continue with outpatient mental health treatment.   Physical Findings: AIMS:  , ,  ,  ,    CIWA:    COWS:     Musculoskeletal: Strength & Muscle Tone: within normal limits Gait & Station: normal Patient leans: N/A   Psychiatric Specialty Exam:  Presentation  General Appearance: Appropriate  for Environment; Casual  Eye Contact:Good  Speech:Normal Rate  Speech Volume:Normal  Handedness:Right   Mood and Affect  Mood:Euthymic  Affect:Congruent   Thought Process  Thought Processes:Coherent; Goal Directed  Descriptions of Associations:Intact  Orientation:Full (Time, Place and Person)  Thought Content:Logical  History of Schizophrenia/Schizoaffective disorder:No data recorded Duration of Psychotic Symptoms:No data  recorded Hallucinations:Hallucinations: None  Ideas of Reference:None  Suicidal Thoughts:Suicidal Thoughts: No  Homicidal Thoughts:Homicidal Thoughts: No   Sensorium  Memory:Immediate Good; Recent Good; Remote Good  Judgment:Intact  Insight:Fair   Executive Functions  Concentration:Fair  Attention Span:Fair  Recall:Fair  Fund of Knowledge:Fair  Language:Fair   Psychomotor Activity  Psychomotor Activity:Psychomotor Activity: Normal   Assets  Assets:Communication Skills; Desire for Improvement; Financial Resources/Insurance; Housing; Physical Health; Resilience; Social Support; Talents/Skills; Transportation; Vocational/Educational   Sleep  Sleep:Sleep: Good Number of Hours of Sleep: 9    Physical Exam: Physical Exam Vitals and nursing note reviewed.  Constitutional:      Appearance: Normal appearance.  HENT:     Head: Normocephalic and atraumatic.     Right Ear: External ear normal.     Left Ear: External ear normal.     Nose: Nose normal.     Mouth/Throat:     Mouth: Mucous membranes are moist.     Pharynx: Oropharynx is clear.  Eyes:     Extraocular Movements: Extraocular movements intact.     Conjunctiva/sclera: Conjunctivae normal.     Pupils: Pupils are equal, round, and reactive to light.  Cardiovascular:     Rate and Rhythm: Normal rate and regular rhythm.     Pulses: Normal pulses.     Heart sounds: Normal heart sounds.  Pulmonary:     Effort: Pulmonary effort is normal.     Breath sounds: Normal breath sounds.  Abdominal:     General: Abdomen is flat.     Palpations: Abdomen is soft.  Musculoskeletal:        General: No swelling. Normal range of motion.     Cervical back: Normal range of motion and neck supple.  Skin:    General: Skin is warm and dry.  Neurological:     General: No focal deficit present.     Mental Status: He is alert and oriented to person, place, and time.  Psychiatric:        Mood and Affect: Mood normal.         Behavior: Behavior normal.        Thought Content: Thought content normal.        Judgment: Judgment normal.   Review of Systems  Constitutional: Negative.   HENT: Negative.    Eyes: Negative.   Respiratory: Negative.    Cardiovascular: Negative.   Gastrointestinal: Negative.   Genitourinary: Negative.   Musculoskeletal: Negative.   Skin: Negative.   Neurological: Negative.   Endo/Heme/Allergies: Negative.   Psychiatric/Behavioral:  Negative for hallucinations, memory loss and suicidal ideas. The patient does not have insomnia.   Blood pressure (!) 156/93, pulse 92, temperature 98.1 F (36.7 C), temperature source Oral, resp. rate 18, height 6\' 2"  (1.88 m), weight (!) 158.8 kg, SpO2 98 %. Body mass index is 44.94 kg/m.   Social History   Tobacco Use  Smoking Status Former  Smokeless Tobacco Never   Tobacco Cessation:  N/A, patient does not currently use tobacco products   Blood Alcohol level:  Lab Results  Component Value Date   ETH <10 01/02/2021    Metabolic Disorder Labs:  No results found  for: HGBA1C, MPG No results found for: PROLACTIN Lab Results  Component Value Date   CHOL 186 08/31/2019   TRIG 175 (H) 08/31/2019   HDL 32 (L) 08/31/2019   LDLCALC 123 (H) 08/31/2019    See Psychiatric Specialty Exam and Suicide Risk Assessment completed by Attending Physician prior to discharge.  Discharge destination:  Home  Is patient on multiple antipsychotic therapies at discharge:  No   Has Patient had three or more failed trials of antipsychotic monotherapy by history:  No  Recommended Plan for Multiple Antipsychotic Therapies: NA  Discharge Instructions     Diet - low sodium heart healthy   Complete by: As directed    Increase activity slowly   Complete by: As directed       Allergies as of 01/06/2021   No Known Allergies      Medication List     TAKE these medications      Indication  escitalopram 10 MG tablet Commonly known as:  LEXAPRO Take 1 tablet (10 mg total) by mouth at bedtime.  Indication: Generalized Anxiety Disorder, Major Depressive Disorder, Obsessive Compulsive Disorder, Posttraumatic Stress Disorder   valACYclovir 1000 MG tablet Commonly known as: VALTREX Take 1 tablet (1,000 mg total) by mouth daily.  Indication: Herpes Simplex Infection         Follow-up recommendations:  Activity:  as tolerated Diet:  low sodium heart healthy diet  Comments:  30-day scripts with 1 refill for Lexparo sent to [facility] on [address] per request. Follow-up with PCP about elevated blood pressure readings in hospital. If suicidal ideations occur call 911 or proceed to nearest emergency room.   Signed: Jesse Sans, MD 01/06/2021, 9:04 AM

## 2021-01-06 NOTE — Plan of Care (Signed)
  Problem: Coping Skills Goal: STG - Patient will identify 3 positive coping skills strategies to use post d/c within 5 recreation therapy group sessions Description: STG - Patient will identify 3 positive coping skills strategies to use post d/c within 5 recreation therapy group sessions 01/06/2021 1217 by Ernest Haber, LRT Outcome: Not Applicable [id number] by Ernest Haber, LRT Outcome: Not Met (add Reason) Note: Patient spent his time in his room.

## 2021-01-06 NOTE — Progress Notes (Signed)
Recreation Therapy Notes  INPATIENT RECREATION TR PLAN  Patient Details Name: GIAN YBARRA MRN: [id number] DOB: 1990-11-30 Today's Date: 01/06/2021  Rec Therapy Plan Is patient appropriate for Therapeutic Recreation?: Yes Treatment times per week: at least 3 Estimated Length of Stay: 5-7 days TR Treatment/Interventions: Group participation (Comment)  Discharge Criteria Pt will be discharged from therapy if:: Discharged Treatment plan/goals/alternatives discussed and agreed upon by:: Patient/family  Discharge Summary Short term goals set: Patient will identify 3 positive coping skills strategies to use post d/c within 5 recreation therapy group sessions Short term goals met: Not met Reason goals not met: Patient did not attend any groups. Therapeutic equipment acquired: N/A Reason patient discharged from therapy: Discharge from hospital Pt/family agrees with progress & goals achieved: Yes Date patient discharged from therapy: 01/06/21   Latise Dilley 01/06/2021, 12:18 PM

## 2021-01-06 NOTE — Progress Notes (Signed)
Recreation Therapy Notes    Date: 01/06/2021  Time: 9:50 am   Location: Craft room    Behavioral response: N/A   Intervention Topic: Goals  Discussion/Intervention: Patient did not attend group.   Clinical Observations/Feedback:  Patient did not attend group.   Blakelee Allington LRT/CTRS        Tashiba Timoney 01/06/2021 12:16 PM

## 2021-01-07 ENCOUNTER — Telehealth: Payer: Self-pay

## 2021-01-07 NOTE — Telephone Encounter (Signed)
Transition Care Management Follow-up Telephone Call Date of discharge and from where: 01/06/21 from [facility] How have you been since you were released from the hospital? "I've been ok" Any questions or concerns? No  Items Reviewed: Did the pt receive and understand the discharge instructions provided? Yes  Medications obtained and verified? Yes  Other? No  Any new allergies since your discharge? No  Dietary orders reviewed? Yes Do you have support at home? Yes   Home Care and Equipment/Supplies: Were home health services ordered? no If so, what is the name of the agency? Not applicable  Has the agency set up a time to come to the patient's home? not applicable Were any new equipment or medical supplies ordered?  No What is the name of the medical supply agency? Not applicable Were you able to get the supplies/equipment? not applicable Do you have any questions related to the use of the equipment or supplies? No  Functional Questionnaire: (I = Independent and D = Dependent) ADLs: I  Bathing/Dressing- I  Meal Prep- I  Eating- I  Maintaining continence- I  Transferring/Ambulation- I  Managing Meds- I  Follow up appointments reviewed:  PCP Hospital f/u appt confirmed? No  referral for scheduling team to schedule. Specialist Hospital f/u appt confirmed? Yes  Scheduled to see [facility] on 01/07/21 @ 6pm.. Are transportation arrangements needed? No  If their condition worsens, is the pt aware to call PCP or go to the Emergency Dept.? Yes Was the patient provided with contact information for the PCP's office or ED? Yes Was to pt encouraged to call back with questions or concerns? Yes    Kathyrn Sheriff, RN, MSN, BSN, CCM [facility] Care Management Coordinator [phone]

## 2021-02-21 ENCOUNTER — Ambulatory Visit: Payer: Self-pay

## 2021-02-21 NOTE — Telephone Encounter (Signed)
Patient called and says he has been taking Lexapro for about 3 weeks and he says it's not helping. He says he's still feeling depression, not wanting to get up and go to work or do things, but he says he makes himself do it and go to work. He denies thoughts of suicide or harming himself or others. He says he has a therapist that he talks to and is helping, he's not where he was 3 weeks ago, but still not where he feels he should be on the medication. He says he's been without it for 1 day. I advised he has a refill left at [facility] and to go get that today and start back taking it. I advised he has an appointment on Monday at 0820 with Rodman Pickle, DNP to follow up on this. Advised to go to the ED with any thoughts of harming himself or worsening depression. He says he's concerned about a bump on his scrotum thinking it may be an outbreak of HSV. He says he knows it's usually in clusters, but this is one white bump. He says he squeezed it and clear fluid came out. I advised not to squeeze bumps, apply antibiotic ointment, if draining cover with gauze, if becomes red, tender, swollen, fever to go to the UC over the weekend. I advised to continue taking Valacyclovir as prescribed and to mention to the provider on Monday to follow up. Patient verbalized understanding.   Summary: Clinical Advice   Patient states escitalopram (LEXAPRO) 10 MG tablet is not working for his depression and anxiety, patient has a follow up appointment on 02/24/2021 to discuss alternatives.   Patient has a bump on his scrotum and has been dx in the past with HSV AND HSV 1       Reason for Disposition  [1] New or changed psychiatric medications > 2 weeks ago AND [2] not feeling any better  Pimple, not a boil  Answer Assessment - Initial Assessment Questions 1. CONCERN: "What happened that made you call today?"     Ran out of Lexapro, not working since taking 3 weeks ago, no improvement 2. DEPRESSION SYMPTOM SCREENING: "How are  you feeling overall?" (e.g., decreased energy, increased sleeping or difficulty sleeping, difficulty concentrating, feelings of sadness, guilt, hopelessness, or worthlessness)     Feeling depressed-sad, hopelessness, not wanting to do anything, obsessive about things 3. RISK OF HARM - SUICIDAL IDEATION:  "Do you ever have thoughts of hurting or killing yourself?"  (e.g., yes, no, no but preoccupation with thoughts about death) No thoughts of suicide or harming self or others   - INTENT:  "Do you have thoughts of hurting or killing yourself right NOW?" (e.g., yes, no, N/A)   - PLAN: "Do you have a specific plan for how you would do this?" (e.g., gun, knife, overdose, no plan, N/A)     No plan, no thoughts of harming 4. RISK OF HARM - HOMICIDAL IDEATION:  "Do you ever have thoughts of hurting or killing someone else?"  (e.g., yes, no, no but preoccupation with thoughts about death)   - INTENT:  "Do you have thoughts of hurting or killing someone right NOW?" (e.g., yes, no, N/A)   - PLAN: "Do you have a specific plan for how you would do this?" (e.g., gun, knife, no plan, N/A)      No thoughts, no plan 5. FUNCTIONAL IMPAIRMENT: "How have things been going for you overall? Have you had more difficulty than usual doing your normal  daily activities?"  (e.g., better, same, worse; self-care, school, work, interactions)     More difficult, but still get up and go and do 6. SUPPORT: "Who is with you now?" "Who do you live with?" "Do you have family or friends who you can talk to?"      Yes 7. THERAPIST: "Do you have a counselor or therapist? Name?"     Yes-[facility] 8. STRESSORS: "Has there been any new stress or recent changes in your life?"     Relationship stuff, worried about spot on scrotum 9. ALCOHOL USE OR SUBSTANCE USE (DRUG USE): "Do you drink alcohol or use any illegal drugs?"     No alcohol, marijuana every once in a while 10. OTHER: "Do you have any other physical symptoms  right now?" (e.g., fever)       Bump on scrotal (on Valacyclovir) 11. PREGNANCY: "Is there any chance you are pregnant?" "When was your last menstrual period?"       N/A  Answer Assessment - Initial Assessment Questions 1. APPEARANCE of BOIL: "What does the boil look like?"      Little white bump 2. LOCATION: "Where is the boil located?"      Scrotum 3. NUMBER: "How many boils are there?"      1 4. SIZE: "How big is the boil?" (e.g., inches, cm; compare to size of a coin or other object)     N/A 5. ONSET: "When did the boil start?"     N/A 6. PAIN: "Is there any pain?" If Yes, ask: "How bad is the pain?"   (Scale 1-10; or mild, moderate, severe)     No 7. FEVER: "Do you have a fever?" If Yes, ask: "What is it, how was it measured, and when did it start?"      No 8. SOURCE: "Have you been around anyone with boils or other Staph infections?" "Have you ever had boils before?"     On Valacyclovir for outbreaks 9. OTHER SYMPTOMS: "Do you have any other symptoms?" (e.g., shaking chills, weakness, rash elsewhere on body)     No 10. PREGNANCY: "Is there any chance you are pregnant?" "When was your last menstrual period?"       N/A  Protocols used: Depression-A-AH, Boil (Skin Abscess)-A-AH

## 2021-02-24 ENCOUNTER — Ambulatory Visit: Payer: BC Managed Care – PPO | Admitting: Nurse Practitioner

## 2021-02-24 NOTE — Progress Notes (Deleted)
Established Patient Office Visit  Subjective:  Patient ID: William Reid, male    DOB: [birth year]  Age: 30 y.o. MRN: [id number]  CC: No chief complaint on file.   HPI William Reid presents for follow up on depression and sore on his scrotum.   Past Medical History:  Diagnosis Date   Seizures (HCC)     No past surgical history on file.  Family History  Problem Relation Age of Onset   Epilepsy Father    Hypertension Father     Social History   Socioeconomic History   Marital status: Single    Spouse name: Not on file   Number of children: Not on file   Years of education: Not on file   Highest education level: Not on file  Occupational History   Not on file  Tobacco Use   Smoking status: Former   Smokeless tobacco: Never  Vaping Use   Vaping Use: Never used  Substance and Sexual Activity   Alcohol use: Yes    Comment: occasionally   Drug use: Yes    Types: Marijuana   Sexual activity: Yes  Other Topics Concern   Not on file  Social History Narrative   Not on file   Social Determinants of Health   Financial Resource Strain: Not on file  Food Insecurity: Not on file  Transportation Needs: Not on file  Physical Activity: Not on file  Stress: Not on file  Social Connections: Not on file  Intimate Partner Violence: Not on file    Outpatient Medications Prior to Visit  Medication Sig Dispense Refill   escitalopram (LEXAPRO) 10 MG tablet Take 1 tablet (10 mg total) by mouth at bedtime. 30 tablet 1   valACYclovir (VALTREX) 1000 MG tablet Take 1 tablet (1,000 mg total) by mouth daily. (Patient not taking: No sig reported) 90 tablet 4   No facility-administered medications prior to visit.    No Known Allergies  ROS Review of Systems    Objective:    Physical Exam  There were no vitals taken for this visit. Wt Readings from Last 3 Encounters:  01/02/21 (!) 350 lb (158.8 kg)  10/18/20 (!) 374 lb 6.4 oz (169.8 kg)  11/03/19 (!) 364 lb (165.1  kg)     Health Maintenance Due  Topic Date Due   Hepatitis C Screening  Never done   COVID-19 Vaccine (2 - Moderna series) 12/04/2019   INFLUENZA VACCINE  Never done    There are no preventive care reminders to display for this patient.  Lab Results  Component Value Date   TSH 0.912 08/31/2019   Lab Results  Component Value Date   WBC 9.5 01/02/2021   HGB 16.3 01/02/2021   HCT 46.3 01/02/2021   MCV 87.0 01/02/2021   PLT 259 01/02/2021   Lab Results  Component Value Date   NA 137 01/02/2021   K 3.9 01/02/2021   CO2 24 01/02/2021   GLUCOSE 106 (H) 01/02/2021   BUN 12 01/02/2021   CREATININE 0.94 01/02/2021   BILITOT 0.8 01/02/2021   ALKPHOS 72 01/02/2021   AST 27 01/02/2021   ALT 48 (H) 01/02/2021   PROT 7.9 01/02/2021   ALBUMIN 4.6 01/02/2021   CALCIUM 9.5 01/02/2021   ANIONGAP 10 01/02/2021   Lab Results  Component Value Date   CHOL 186 08/31/2019   Lab Results  Component Value Date   HDL 32 (L) 08/31/2019   Lab Results  Component Value Date  LDLCALC 123 (H) 08/31/2019   Lab Results  Component Value Date   TRIG 175 (H) 08/31/2019   No results found for: CHOLHDL No results found for: UCJA7W    Assessment & Plan:   Problem List Items Addressed This Visit   None   No orders of the defined types were placed in this encounter.   Follow-up: No follow-ups on file.    Gerre Scull, NP

## 2021-02-25 ENCOUNTER — Ambulatory Visit: Payer: BC Managed Care – PPO | Admitting: Nurse Practitioner

## 2021-03-19 ENCOUNTER — Ambulatory Visit: Payer: BC Managed Care – PPO | Admitting: Nurse Practitioner

## 2021-03-19 ENCOUNTER — Other Ambulatory Visit: Payer: Self-pay

## 2021-03-19 ENCOUNTER — Encounter: Payer: Self-pay | Admitting: Nurse Practitioner

## 2021-03-19 VITALS — BP 128/75 | HR 75 | Temp 99.0°F | Wt 358.0 lb

## 2021-03-19 DIAGNOSIS — F411 Generalized anxiety disorder: Secondary | ICD-10-CM

## 2021-03-19 DIAGNOSIS — F323 Major depressive disorder, single episode, severe with psychotic features: Secondary | ICD-10-CM

## 2021-03-19 DIAGNOSIS — F332 Major depressive disorder, recurrent severe without psychotic features: Secondary | ICD-10-CM

## 2021-03-19 MED ORDER — VENLAFAXINE HCL ER 37.5 MG PO CP24: 37.5000 mg | ORAL_CAPSULE | Freq: Every day | ORAL | 0 refills | Status: DC

## 2021-03-19 NOTE — Progress Notes (Signed)
BP 128/75    Pulse 75    Temp 99 F (37.2 C) (Oral)    Wt (!) 358 lb (162.4 kg)    SpO2 96%    BMI 45.96 kg/m    Subjective:    Patient ID: William Reid, male    DOB: 1990-09-20, 30 y.o.   MRN: [id number]  HPI: William Reid is a 30 y.o. male  Chief Complaint  Patient presents with   Depression   DEPRESSION/ANXIETY Patient was started on lexapro at his last visit.  He states it never really worked for his depression/anxiety.  He feels it is more anxiety related which brings on the depression.  He stopped taking the Lexapro about a month ago.  States he would like to just turn his mind off.  Denies SI.  Would like to try another medication.   Flowsheet Row Office Visit from 03/19/2021 in [facility]  PHQ-9 Total Score 16      GAD 7 : Generalized Anxiety Score 03/19/2021 08/31/2019 08/18/2019  Nervous, Anxious, on Edge 3 1 3   Control/stop worrying 3 2 3   Worry too much - different things 3 2 3   Trouble relaxing 3 1 3   Restless 1 2 3   Easily annoyed or irritable 2 2 3   Afraid - awful might happen 3 1 3   Total GAD 7 Score 18 11 21   Anxiety Difficulty Extremely difficult Somewhat difficult Very difficult      Relevant past medical, surgical, family and social history reviewed and updated as indicated. Interim medical history since our last visit reviewed. Allergies and medications reviewed and updated.  Review of Systems  Psychiatric/Behavioral:  Positive for dysphoric mood. Negative for suicidal ideas. The patient is nervous/anxious.    Per HPI unless specifically indicated above     Objective:    BP 128/75    Pulse 75    Temp 99 F (37.2 C) (Oral)    Wt (!) 358 lb (162.4 kg)    SpO2 96%    BMI 45.96 kg/m   Wt Readings from Last 3 Encounters:  03/19/21 (!) 358 lb (162.4 kg)  01/02/21 (!) 350 lb (158.8 kg)  10/18/20 (!) 374 lb 6.4 oz (169.8 kg)    Physical Exam Vitals and nursing note reviewed.  Constitutional:      General: He is not in acute  distress.    Appearance: Normal appearance. He is obese. He is not ill-appearing, toxic-appearing or diaphoretic.  HENT:     Head: Normocephalic.     Right Ear: External ear normal.     Left Ear: External ear normal.     Nose: Nose normal. No congestion or rhinorrhea.     Mouth/Throat:     Mouth: Mucous membranes are moist.  Eyes:     General:        Right eye: No discharge.        Left eye: No discharge.     Extraocular Movements: Extraocular movements intact.     Conjunctiva/sclera: Conjunctivae normal.     Pupils: Pupils are equal, round, and reactive to light.  Cardiovascular:     Rate and Rhythm: Normal rate and regular rhythm.     Heart sounds: No murmur heard. Pulmonary:     Effort: Pulmonary effort is normal. No respiratory distress.     Breath sounds: Normal breath sounds. No wheezing, rhonchi or rales.  Abdominal:     General: Abdomen is flat. Bowel sounds are normal.  Musculoskeletal:  Cervical back: Normal range of motion and neck supple.  Skin:    General: Skin is warm and dry.     Capillary Refill: Capillary refill takes less than 2 seconds.  Neurological:     General: No focal deficit present.     Mental Status: He is alert and oriented to person, place, and time.  Psychiatric:        Mood and Affect: Mood normal.        Behavior: Behavior normal.        Thought Content: Thought content normal.        Judgment: Judgment normal.    Results for orders placed or performed during the hospital encounter of 01/02/21  Resp Panel by RT-PCR (Flu A&B, Covid) Nasopharyngeal Swab   Specimen: Nasopharyngeal Swab; Nasopharyngeal(NP) swabs in vial transport medium  Result Value Ref Range   SARS Coronavirus 2 by RT PCR NEGATIVE NEGATIVE   Influenza A by PCR NEGATIVE NEGATIVE   Influenza B by PCR NEGATIVE NEGATIVE  Comprehensive metabolic panel  Result Value Ref Range   Sodium 137 135 - 145 mmol/L   Potassium 3.9 3.5 - 5.1 mmol/L   Chloride 103 98 - 111 mmol/L    CO2 24 22 - 32 mmol/L   Glucose, Bld 106 (H) 70 - 99 mg/dL   BUN 12 6 - 20 mg/dL   Creatinine, Ser 0.94 0.61 - 1.24 mg/dL   Calcium 9.5 8.9 - 10.3 mg/dL   Total Protein 7.9 6.5 - 8.1 g/dL   Albumin 4.6 3.5 - 5.0 g/dL   AST 27 15 - 41 U/L   ALT 48 (H) 0 - 44 U/L   Alkaline Phosphatase 72 38 - 126 U/L   Total Bilirubin 0.8 0.3 - 1.2 mg/dL   GFR, Estimated >60 >60 mL/min   Anion gap 10 5 - 15  Ethanol  Result Value Ref Range   Alcohol, Ethyl (B) Q000111Q Q000111Q mg/dL  Salicylate level  Result Value Ref Range   Salicylate Lvl Q000111Q (L) 7.0 - 30.0 mg/dL  Acetaminophen level  Result Value Ref Range   Acetaminophen (Tylenol), Serum <10 (L) 10 - 30 ug/mL  cbc  Result Value Ref Range   WBC 9.5 4.0 - 10.5 K/uL   RBC 5.32 4.22 - 5.81 MIL/uL   Hemoglobin 16.3 13.0 - 17.0 g/dL   HCT 46.3 39.0 - 52.0 %   MCV 87.0 80.0 - 100.0 fL   MCH 30.6 26.0 - 34.0 pg   MCHC 35.2 30.0 - 36.0 g/dL   RDW 11.9 11.5 - 15.5 %   Platelets 259 150 - 400 K/uL   nRBC 0.0 0.0 - 0.2 %  Urine Drug Screen, Qualitative  Result Value Ref Range   Tricyclic, Ur Screen NONE DETECTED NONE DETECTED   Amphetamines, Ur Screen NONE DETECTED NONE DETECTED   MDMA (Ecstasy)Ur Screen NONE DETECTED NONE DETECTED   Cocaine Metabolite,Ur [place] NONE DETECTED NONE DETECTED   Opiate, Ur Screen NONE DETECTED NONE DETECTED   Phencyclidine (PCP) Ur S NONE DETECTED NONE DETECTED   Cannabinoid 50 Ng, Ur [place] NONE DETECTED NONE DETECTED   Barbiturates, Ur Screen NONE DETECTED NONE DETECTED   Benzodiazepine, Ur Scrn NONE DETECTED NONE DETECTED   Methadone Scn, Ur NONE DETECTED NONE DETECTED      Assessment & Plan:   Problem List Items Addressed This Visit       Other   Severe major depression, single episode, with psychotic features ([place]) - Primary    Chronic. Not  well controlled. Will start Effexor 37.5mg  daily.  Side effects and benefits of medication discussed with patient during visit. Will follow up with patient in 1 month for  reevaluation and medication adjustment if necessary.       Relevant Medications   venlafaxine XR (EFFEXOR XR) 37.5 MG 24 hr capsule   MDD (major depressive disorder), recurrent severe, without psychosis (HCC)    Chronic. Not well controlled. Will start Effexor 37.5mg  daily.  Side effects and benefits of medication discussed with patient during visit. Will follow up with patient in 1 month for reevaluation and medication adjustment if necessary.       Relevant Medications   venlafaxine XR (EFFEXOR XR) 37.5 MG 24 hr capsule   Generalized anxiety disorder    Chronic. Not well controlled. Will start Effexor 37.5mg  daily.  Side effects and benefits of medication discussed with patient during visit. Will follow up with patient in 1 month for reevaluation and medication adjustment if necessary.       Relevant Medications   venlafaxine XR (EFFEXOR XR) 37.5 MG 24 hr capsule     Follow up plan: Return in about 1 month (around 04/19/2021) for Depression/Anxiety FU.

## 2021-03-19 NOTE — Assessment & Plan Note (Signed)
Chronic. Not well controlled. Will start Effexor 37.5mg  daily.  Side effects and benefits of medication discussed with patient during visit. Will follow up with patient in 1 month for reevaluation and medication adjustment if necessary.

## 2021-04-15 ENCOUNTER — Other Ambulatory Visit: Payer: Self-pay | Admitting: Nurse Practitioner

## 2021-04-15 NOTE — Telephone Encounter (Signed)
Requested medications are due for refill today.  yes  Requested medications are on the active medications list.  yes  Last refill. 03/19/2021  Future visit scheduled.   yes  Notes to clinic.  Failed protocol d/t expired labs.    Requested Prescriptions  Pending Prescriptions Disp Refills   venlafaxine XR (EFFEXOR-XR) 37.5 MG 24 hr capsule [Pharmacy Med Name: VENLAFAXINE HCL ER 37.5 MG CAP] 30 capsule 0    Sig: TAKE 1 CAPSULE BY MOUTH DAILY WITH BREAKFAST.     Psychiatry: Antidepressants - SNRI - desvenlafaxine & venlafaxine Failed - 04/15/2021  1:20 AM      Failed - LDL in normal range and within 360 days    LDL Chol Calc (NIH)  Date Value Ref Range Status  08/31/2019 123 (H) 0 - 99 mg/dL Final          Failed - Total Cholesterol in normal range and within 360 days    Cholesterol, Total  Date Value Ref Range Status  08/31/2019 186 100 - 199 mg/dL Final          Failed - Triglycerides in normal range and within 360 days    Triglycerides  Date Value Ref Range Status  08/31/2019 175 (H) 0 - 149 mg/dL Final          Passed - Completed PHQ-2 or PHQ-9 in the last 360 days      Passed - Last BP in normal range    BP Readings from Last 1 Encounters:  03/19/21 128/75          Passed - Valid encounter within last 6 months    Recent Outpatient Visits           3 weeks ago Severe major depression, single episode, with psychotic features ([place])   [facility] Jon Billings, NP   5 months ago Exposure to herpes simplex virus (HSV)   [place], Henrine Screws T, NP   1 year ago Swollen tonsil   [facility] [place], [place]   1 year ago Anxiety and depression   [facility] [place], [place]   1 year ago Anxiety and depression   [place], Lilia Argue, [place]       Future Appointments             In 6 days Jon Billings, NP [facility], [place]

## 2021-04-21 ENCOUNTER — Other Ambulatory Visit: Payer: Self-pay

## 2021-04-21 ENCOUNTER — Encounter: Payer: Self-pay | Admitting: Nurse Practitioner

## 2021-04-21 ENCOUNTER — Ambulatory Visit (INDEPENDENT_AMBULATORY_CARE_PROVIDER_SITE_OTHER): Payer: BC Managed Care – PPO | Admitting: Nurse Practitioner

## 2021-04-21 VITALS — BP 145/82 | HR 60 | Temp 98.4°F | Ht 74.02 in | Wt 352.6 lb

## 2021-04-21 DIAGNOSIS — F332 Major depressive disorder, recurrent severe without psychotic features: Secondary | ICD-10-CM

## 2021-04-21 DIAGNOSIS — F411 Generalized anxiety disorder: Secondary | ICD-10-CM

## 2021-04-21 MED ORDER — VENLAFAXINE HCL ER 75 MG PO CP24: 75.0000 mg | ORAL_CAPSULE | Freq: Every day | ORAL | 0 refills | Status: DC

## 2021-04-21 NOTE — Assessment & Plan Note (Signed)
Chronic.  Improving.  Will increase Effexor to 75mg daily.  Return to clinic in 1 months for reevaluation.  Call sooner if concerns arise.  °

## 2021-04-21 NOTE — Assessment & Plan Note (Signed)
Chronic.  Improving.  Will increase Effexor to 75mg  daily.  Return to clinic in 1 months for reevaluation.  Call sooner if concerns arise.

## 2021-04-21 NOTE — Progress Notes (Signed)
BP (!) 145/82    Pulse 60    Temp 98.4 F (36.9 C) (Oral)    Ht 6' 2.02" (1.88 m)    Wt (!) 352 lb 9.6 oz (159.9 kg)    SpO2 96%    BMI 45.25 kg/m    Subjective:    Patient ID: William Reid, male    DOB: 06/11/1990, 31 y.o.   MRN: [id number]  HPI: William Reid is a 31 y.o. male  Chief Complaint  Patient presents with   Depression   Anxiety   DEPRESSION/ANXIETY Patient was started on Effexor and he feels like the anxiety comes on strong at night.   He does feel like the medication wears off at the end of the day.  He has lost 22lbs since July.     [place] Office Visit from 04/21/2021 in [place]  PHQ-9 Total Score 7      GAD 7 : Generalized Anxiety Score 04/21/2021 03/19/2021 08/31/2019 08/18/2019  Nervous, Anxious, on Edge 2 3 1 3   Control/stop worrying 2 3 2 3   Worry too much - different things 3 3 2 3   Trouble relaxing 2 3 1 3   Restless 1 1 2 3   Easily annoyed or irritable 2 2 2 3   Afraid - awful might happen 2 3 1 3   Total GAD 7 Score 14 18 11 21   Anxiety Difficulty Somewhat difficult Extremely difficult Somewhat difficult Very difficult      Relevant past medical, surgical, family and social history reviewed and updated as indicated. Interim medical history since our last visit reviewed. Allergies and medications reviewed and updated.  Review of Systems  Psychiatric/Behavioral:  Positive for dysphoric mood. Negative for suicidal ideas. The patient is nervous/anxious.    Per HPI unless specifically indicated above     Objective:    BP (!) 145/82    Pulse 60    Temp 98.4 F (36.9 C) (Oral)    Ht 6' 2.02" (1.88 m)    Wt (!) 352 lb 9.6 oz (159.9 kg)    SpO2 96%    BMI 45.25 kg/m   Wt Readings from Last 3 Encounters:  04/21/21 (!) 352 lb 9.6 oz (159.9 kg)  03/19/21 (!) 358 lb (162.4 kg)  01/02/21 (!) 350 lb (158.8 kg)    Physical Exam Vitals and nursing note reviewed.  Constitutional:      General: He is not in acute distress.     Appearance: Normal appearance. He is obese. He is not ill-appearing, toxic-appearing or diaphoretic.  HENT:     Head: Normocephalic.     Right Ear: External ear normal.     Left Ear: External ear normal.     Nose: Nose normal. No congestion or rhinorrhea.     Mouth/Throat:     Mouth: Mucous membranes are moist.  Eyes:     General:        Right eye: No discharge.        Left eye: No discharge.     Extraocular Movements: Extraocular movements intact.     Conjunctiva/sclera: Conjunctivae normal.     Pupils: Pupils are equal, round, and reactive to light.  Cardiovascular:     Rate and Rhythm: Normal rate and regular rhythm.     Heart sounds: No murmur heard. Pulmonary:     Effort: Pulmonary effort is normal. No respiratory distress.     Breath sounds: Normal breath sounds. No wheezing, rhonchi or rales.  Abdominal:  General: Abdomen is flat. Bowel sounds are normal.  Musculoskeletal:     Cervical back: Normal range of motion and neck supple.  Skin:    General: Skin is warm and dry.     Capillary Refill: Capillary refill takes less than 2 seconds.  Neurological:     General: No focal deficit present.     Mental Status: He is alert and oriented to person, place, and time.  Psychiatric:        Mood and Affect: Mood normal.        Behavior: Behavior normal.        Thought Content: Thought content normal.        Judgment: Judgment normal.    Results for orders placed or performed during the hospital encounter of 01/02/21  Resp Panel by RT-PCR (Flu A&B, Covid) Nasopharyngeal Swab   Specimen: Nasopharyngeal Swab; Nasopharyngeal(NP) swabs in vial transport medium  Result Value Ref Range   SARS Coronavirus 2 by RT PCR NEGATIVE NEGATIVE   Influenza A by PCR NEGATIVE NEGATIVE   Influenza B by PCR NEGATIVE NEGATIVE  Comprehensive metabolic panel  Result Value Ref Range   Sodium 137 135 - 145 mmol/L   Potassium 3.9 3.5 - 5.1 mmol/L   Chloride 103 98 - 111 mmol/L   CO2 24 22 - 32  mmol/L   Glucose, Bld 106 (H) 70 - 99 mg/dL   BUN 12 6 - 20 mg/dL   Creatinine, Ser 0.94 0.61 - 1.24 mg/dL   Calcium 9.5 8.9 - 10.3 mg/dL   Total Protein 7.9 6.5 - 8.1 g/dL   Albumin 4.6 3.5 - 5.0 g/dL   AST 27 15 - 41 U/L   ALT 48 (H) 0 - 44 U/L   Alkaline Phosphatase 72 38 - 126 U/L   Total Bilirubin 0.8 0.3 - 1.2 mg/dL   GFR, Estimated >60 >60 mL/min   Anion gap 10 5 - 15  Ethanol  Result Value Ref Range   Alcohol, Ethyl (B) Q000111Q Q000111Q mg/dL  Salicylate level  Result Value Ref Range   Salicylate Lvl Q000111Q (L) 7.0 - 30.0 mg/dL  Acetaminophen level  Result Value Ref Range   Acetaminophen (Tylenol), Serum <10 (L) 10 - 30 ug/mL  cbc  Result Value Ref Range   WBC 9.5 4.0 - 10.5 K/uL   RBC 5.32 4.22 - 5.81 MIL/uL   Hemoglobin 16.3 13.0 - 17.0 g/dL   HCT 46.3 39.0 - 52.0 %   MCV 87.0 80.0 - 100.0 fL   MCH 30.6 26.0 - 34.0 pg   MCHC 35.2 30.0 - 36.0 g/dL   RDW 11.9 11.5 - 15.5 %   Platelets 259 150 - 400 K/uL   nRBC 0.0 0.0 - 0.2 %  Urine Drug Screen, Qualitative  Result Value Ref Range   Tricyclic, Ur Screen NONE DETECTED NONE DETECTED   Amphetamines, Ur Screen NONE DETECTED NONE DETECTED   MDMA (Ecstasy)Ur Screen NONE DETECTED NONE DETECTED   Cocaine Metabolite,Ur [place] NONE DETECTED NONE DETECTED   Opiate, Ur Screen NONE DETECTED NONE DETECTED   Phencyclidine (PCP) Ur S NONE DETECTED NONE DETECTED   Cannabinoid 50 Ng, Ur [place] NONE DETECTED NONE DETECTED   Barbiturates, Ur Screen NONE DETECTED NONE DETECTED   Benzodiazepine, Ur Scrn NONE DETECTED NONE DETECTED   Methadone Scn, Ur NONE DETECTED NONE DETECTED      Assessment & Plan:   Problem List Items Addressed This Visit       Other   MDD (major  depressive disorder), recurrent severe, without psychosis ([place]) - Primary    Chronic.  Improving.  Will increase Effexor to 75mg  daily.  Return to clinic in 1 months for reevaluation.  Call sooner if concerns arise.        Relevant Medications   venlafaxine XR (EFFEXOR XR) 75  MG 24 hr capsule   Generalized anxiety disorder    Chronic.  Improving.  Will increase Effexor to 75mg  daily.  Return to clinic in 1 months for reevaluation.  Call sooner if concerns arise.       Relevant Medications   venlafaxine XR (EFFEXOR XR) 75 MG 24 hr capsule     Follow up plan: Return in about 1 month (around 05/22/2021) for Depression/Anxiety FU.

## 2021-05-15 ENCOUNTER — Other Ambulatory Visit: Payer: Self-pay

## 2021-05-15 ENCOUNTER — Ambulatory Visit (INDEPENDENT_AMBULATORY_CARE_PROVIDER_SITE_OTHER): Payer: BC Managed Care – PPO | Admitting: Nurse Practitioner

## 2021-05-15 ENCOUNTER — Encounter: Payer: Self-pay | Admitting: Nurse Practitioner

## 2021-05-15 VITALS — BP 140/90 | HR 83 | Temp 98.5°F | Ht 74.02 in | Wt 344.5 lb

## 2021-05-15 DIAGNOSIS — F332 Major depressive disorder, recurrent severe without psychotic features: Secondary | ICD-10-CM | POA: Diagnosis not present

## 2021-05-15 DIAGNOSIS — F419 Anxiety disorder, unspecified: Secondary | ICD-10-CM | POA: Diagnosis not present

## 2021-05-15 DIAGNOSIS — F32A Depression, unspecified: Secondary | ICD-10-CM

## 2021-05-15 MED ORDER — VENLAFAXINE HCL ER 37.5 MG PO CP24: 37.5000 mg | ORAL_CAPSULE | Freq: Every day | ORAL | 0 refills | Status: DC

## 2021-05-15 MED ORDER — ATOMOXETINE HCL 10 MG PO CAPS: 10.0000 mg | ORAL_CAPSULE | Freq: Every day | ORAL | 0 refills | Status: DC

## 2021-05-15 NOTE — Assessment & Plan Note (Signed)
Chronic.  Not well controlled.  Will decrease Effexor to 37.5mg  daily and start Strattera 10mg  daily.  Side effects and benefits of medication not well controlled.  If symptoms not improved, will change to Celexa at next visit.  Return to clinic in 1 months for reevaluation.  Call sooner if concerns arise.

## 2021-05-15 NOTE — Assessment & Plan Note (Signed)
Chronic.  Not well controlled.  Will decrease Effexor to 37.5mg daily and start Strattera 10mg daily.  Side effects and benefits of medication not well controlled.  If symptoms not improved, will change to Celexa at next visit.  Return to clinic in 1 months for reevaluation.  Call sooner if concerns arise.  °

## 2021-05-15 NOTE — Progress Notes (Signed)
BP 140/90    Pulse 83    Temp 98.5 F (36.9 C) (Oral)    Ht 6' 2.02" (1.88 m)    Wt (!) 344 lb 8 oz (156.3 kg)    SpO2 97%    BMI 44.21 kg/m    Subjective:    Patient ID: William Reid, male    DOB: February 24, 1991, 31 y.o.   MRN: [id number]  HPI: William Reid is a 31 y.o. male  Chief Complaint  Patient presents with   Med Change Request   DEPRESSION/ANXIETY Patient states he doesn't feel like the Effexor is working for him..  States it has worsened each day.  Feels like it has gotten to the point where he doesn't want to be around people or do anything.  Patient states he tried Oncologist from a friend and it helped his anxiety.    [place] Office Visit from 05/15/2021 in [place]  PHQ-9 Total Score 15      GAD 7 : Generalized Anxiety Score 05/15/2021 04/21/2021 03/19/2021 08/31/2019  Nervous, Anxious, on Edge 3 2 3 1   Control/stop worrying 3 2 3 2   Worry too much - different things 3 3 3 2   Trouble relaxing 3 2 3 1   Restless 3 1 1 2   Easily annoyed or irritable 3 2 2 2   Afraid - awful might happen 3 2 3 1   Total GAD 7 Score 21 14 18 11   Anxiety Difficulty Somewhat difficult Somewhat difficult Extremely difficult Somewhat difficult     Relevant past medical, surgical, family and social history reviewed and updated as indicated. Interim medical history since our last visit reviewed. Allergies and medications reviewed and updated.  Review of Systems  Psychiatric/Behavioral:  Positive for dysphoric mood. Negative for suicidal ideas. The patient is nervous/anxious.    Per HPI unless specifically indicated above     Objective:    BP 140/90    Pulse 83    Temp 98.5 F (36.9 C) (Oral)    Ht 6' 2.02" (1.88 m)    Wt (!) 344 lb 8 oz (156.3 kg)    SpO2 97%    BMI 44.21 kg/m   Wt Readings from Last 3 Encounters:  05/15/21 (!) 344 lb 8 oz (156.3 kg)  04/21/21 (!) 352 lb 9.6 oz (159.9 kg)  03/19/21 (!) 358 lb (162.4 kg)    Physical Exam Vitals and nursing  note reviewed.  Constitutional:      General: He is not in acute distress.    Appearance: Normal appearance. He is obese. He is not ill-appearing, toxic-appearing or diaphoretic.  HENT:     Head: Normocephalic.     Right Ear: External ear normal.     Left Ear: External ear normal.     Nose: Nose normal. No congestion or rhinorrhea.     Mouth/Throat:     Mouth: Mucous membranes are moist.  Eyes:     General:        Right eye: No discharge.        Left eye: No discharge.     Extraocular Movements: Extraocular movements intact.     Conjunctiva/sclera: Conjunctivae normal.     Pupils: Pupils are equal, round, and reactive to light.  Cardiovascular:     Rate and Rhythm: Normal rate and regular rhythm.     Heart sounds: No murmur heard. Pulmonary:     Effort: Pulmonary effort is normal. No respiratory distress.     Breath sounds: Normal  breath sounds. No wheezing, rhonchi or rales.  Abdominal:     General: Abdomen is flat. Bowel sounds are normal.  Musculoskeletal:     Cervical back: Normal range of motion and neck supple.  Skin:    General: Skin is warm and dry.     Capillary Refill: Capillary refill takes less than 2 seconds.  Neurological:     General: No focal deficit present.     Mental Status: He is alert and oriented to person, place, and time.  Psychiatric:        Mood and Affect: Mood normal.        Behavior: Behavior normal.        Thought Content: Thought content normal.        Judgment: Judgment normal.    Results for orders placed or performed during the hospital encounter of 01/02/21  Resp Panel by RT-PCR (Flu A&B, Covid) Nasopharyngeal Swab   Specimen: Nasopharyngeal Swab; Nasopharyngeal(NP) swabs in vial transport medium  Result Value Ref Range   SARS Coronavirus 2 by RT PCR NEGATIVE NEGATIVE   Influenza A by PCR NEGATIVE NEGATIVE   Influenza B by PCR NEGATIVE NEGATIVE  Comprehensive metabolic panel  Result Value Ref Range   Sodium 137 135 - 145 mmol/L    Potassium 3.9 3.5 - 5.1 mmol/L   Chloride 103 98 - 111 mmol/L   CO2 24 22 - 32 mmol/L   Glucose, Bld 106 (H) 70 - 99 mg/dL   BUN 12 6 - 20 mg/dL   Creatinine, Ser 0.94 0.61 - 1.24 mg/dL   Calcium 9.5 8.9 - 10.3 mg/dL   Total Protein 7.9 6.5 - 8.1 g/dL   Albumin 4.6 3.5 - 5.0 g/dL   AST 27 15 - 41 U/L   ALT 48 (H) 0 - 44 U/L   Alkaline Phosphatase 72 38 - 126 U/L   Total Bilirubin 0.8 0.3 - 1.2 mg/dL   GFR, Estimated >60 >60 mL/min   Anion gap 10 5 - 15  Ethanol  Result Value Ref Range   Alcohol, Ethyl (B) Q000111Q Q000111Q mg/dL  Salicylate level  Result Value Ref Range   Salicylate Lvl Q000111Q (L) 7.0 - 30.0 mg/dL  Acetaminophen level  Result Value Ref Range   Acetaminophen (Tylenol), Serum <10 (L) 10 - 30 ug/mL  cbc  Result Value Ref Range   WBC 9.5 4.0 - 10.5 K/uL   RBC 5.32 4.22 - 5.81 MIL/uL   Hemoglobin 16.3 13.0 - 17.0 g/dL   HCT 46.3 39.0 - 52.0 %   MCV 87.0 80.0 - 100.0 fL   MCH 30.6 26.0 - 34.0 pg   MCHC 35.2 30.0 - 36.0 g/dL   RDW 11.9 11.5 - 15.5 %   Platelets 259 150 - 400 K/uL   nRBC 0.0 0.0 - 0.2 %  Urine Drug Screen, Qualitative  Result Value Ref Range   Tricyclic, Ur Screen NONE DETECTED NONE DETECTED   Amphetamines, Ur Screen NONE DETECTED NONE DETECTED   MDMA (Ecstasy)Ur Screen NONE DETECTED NONE DETECTED   Cocaine Metabolite,Ur [place] NONE DETECTED NONE DETECTED   Opiate, Ur Screen NONE DETECTED NONE DETECTED   Phencyclidine (PCP) Ur S NONE DETECTED NONE DETECTED   Cannabinoid 50 Ng, Ur [place] NONE DETECTED NONE DETECTED   Barbiturates, Ur Screen NONE DETECTED NONE DETECTED   Benzodiazepine, Ur Scrn NONE DETECTED NONE DETECTED   Methadone Scn, Ur NONE DETECTED NONE DETECTED      Assessment & Plan:   Problem List Items Addressed  This Visit       Other   Anxiety and depression - Primary    Chronic.  Not well controlled.  Will decrease Effexor to 37.5mg  daily and start Strattera 10mg  daily.  Side effects and benefits of medication not well controlled.  If symptoms  not improved, will change to Celexa at next visit.  Return to clinic in 1 months for reevaluation.  Call sooner if concerns arise.       Relevant Medications   venlafaxine XR (EFFEXOR XR) 37.5 MG 24 hr capsule   MDD (major depressive disorder), recurrent severe, without psychosis (HCC)    Chronic.  Not well controlled.  Will decrease Effexor to 37.5mg  daily and start Strattera 10mg  daily.  Side effects and benefits of medication not well controlled.  If symptoms not improved, will change to Celexa at next visit.  Return to clinic in 1 months for reevaluation.  Call sooner if concerns arise.        Relevant Medications   venlafaxine XR (EFFEXOR XR) 37.5 MG 24 hr capsule     Follow up plan: Return in about 1 month (around 06/12/2021) for Depression/Anxiety FU.

## 2021-05-16 ENCOUNTER — Other Ambulatory Visit: Payer: Self-pay | Admitting: Nurse Practitioner

## 2021-05-16 NOTE — Telephone Encounter (Signed)
Copied from [place] [phone]. Topic: General - Other >> May 16, 2021  1:11 PM Valere Dross wrote: Reason for CRM: Pt called in stating pharmacy told him he needed a PA on medication atomoxetine (STRATTERA) 10 MG capsule

## 2021-05-19 NOTE — Telephone Encounter (Signed)
PA for Strattera initiated and submitted via Cover My Meds. Key: [id number]

## 2021-05-20 ENCOUNTER — Other Ambulatory Visit: Payer: Self-pay | Admitting: Nurse Practitioner

## 2021-05-20 NOTE — Addendum Note (Signed)
Addended by: Malen Gauze on: 05/20/2021 04:06 PM   Modules accepted: Orders

## 2021-05-20 NOTE — Telephone Encounter (Signed)
Prior authorization was denied via CoverMyMeds. Please advise?

## 2021-05-20 NOTE — Telephone Encounter (Signed)
Please let patient know that the meidcation was denied.  Wellbutrin is similar but it can worsen anxiety.  Would he like to try that?

## 2021-05-20 NOTE — Telephone Encounter (Signed)
Spoke with patient and notified patient of Karen's recommendations. Patient states he has a friend takes the Strattera and has it sent to [facility] pharmacy and then just uses the Good Rx card and get their prescription. Patient would like to know if he can try it. Please advise?

## 2021-05-21 MED ORDER — ATOMOXETINE HCL 10 MG PO CAPS: 10.0000 mg | ORAL_CAPSULE | Freq: Every day | ORAL | 0 refills | Status: DC

## 2021-05-21 NOTE — Addendum Note (Signed)
Addended by: Larae Grooms on: 05/21/2021 07:59 AM   Modules accepted: Orders

## 2021-05-21 NOTE — Telephone Encounter (Signed)
Requested medication (s) are due for refill today: no  Requested medication (s) are on the active medication list: yes  Last refill:  05/20/21  Future visit scheduled: yes  Notes to clinic:  PA is needed. Please advise     Requested Prescriptions  Pending Prescriptions Disp Refills   atomoxetine (STRATTERA) 10 MG capsule [Pharmacy Med Name: ATOMOXETINE HCL 10 MG CAPSULE] 30 capsule 0    Sig: TAKE 1 CAPSULE BY MOUTH EVERY DAY     Not Delegated - Psychiatry: Norepinephrine Reuptake Inhibitor Failed - 05/20/2021  5:42 PM      Failed - This refill cannot be delegated      Failed - Last BP in normal range    BP Readings from Last 1 Encounters:  05/15/21 140/90          Passed - Completed PHQ-2 or PHQ-9 in the last 360 days      Passed - Last Heart Rate in normal range    Pulse Readings from Last 1 Encounters:  05/15/21 83          Passed - Valid encounter within last 6 months    Recent Outpatient Visits           6 days ago Anxiety and depression   [employer], Santiago Glad, NP   1 month ago MDD (major depressive disorder), recurrent severe, without psychosis ([place])   [facility] Jon Billings, NP   2 months ago Severe major depression, single episode, with psychotic features ([place])   [facility] Jon Billings, NP   7 months ago Exposure to herpes simplex virus (HSV)   [place], Henrine Screws T, NP   1 year ago Swollen tonsil   [facility] Volney American, [place]       Future Appointments             In 3 weeks Jon Billings, NP [facility], [place]

## 2021-05-22 ENCOUNTER — Ambulatory Visit: Payer: BC Managed Care – PPO | Admitting: Nurse Practitioner

## 2021-05-25 ENCOUNTER — Other Ambulatory Visit: Payer: Self-pay | Admitting: Nurse Practitioner

## 2021-05-27 NOTE — Telephone Encounter (Signed)
Change in dosage.  Requested Prescriptions  Pending Prescriptions Disp Refills   venlafaxine XR (EFFEXOR-XR) 75 MG 24 hr capsule [Pharmacy Med Name: VENLAFAXINE HCL ER 75 MG CAP] 30 capsule 0    Sig: TAKE 1 CAPSULE BY MOUTH DAILY WITH BREAKFAST.     Psychiatry: Antidepressants - SNRI - desvenlafaxine & venlafaxine Failed - 05/25/2021 10:39 AM      Failed - Last BP in normal range    BP Readings from Last 1 Encounters:  05/15/21 140/90         Failed - Lipid Panel in normal range within the last 12 months    Cholesterol, Total  Date Value Ref Range Status  08/31/2019 186 100 - 199 mg/dL Final   LDL Chol Calc (NIH)  Date Value Ref Range Status  08/31/2019 123 (H) 0 - 99 mg/dL Final   HDL  Date Value Ref Range Status  08/31/2019 32 (L) >39 mg/dL Final   Triglycerides  Date Value Ref Range Status  08/31/2019 175 (H) 0 - 149 mg/dL Final         Passed - Cr in normal range and within 360 days    Creatinine, Ser  Date Value Ref Range Status  01/02/2021 0.94 0.61 - 1.24 mg/dL Final         Passed - Completed PHQ-2 or PHQ-9 in the last 360 days      Passed - Valid encounter within last 6 months    Recent Outpatient Visits          1 week ago Anxiety and depression   [facility] Jon Billings, NP   1 month ago MDD (major depressive disorder), recurrent severe, without psychosis ([place])   [facility] Jon Billings, NP   2 months ago Severe major depression, single episode, with psychotic features ([place])   [facility] Jon Billings, NP   7 months ago Exposure to herpes simplex virus (HSV)   [facility], Henrine Screws T, NP   1 year ago Swollen tonsil   [facility] Volney American, [place]      Future Appointments            In 2 weeks Jon Billings, NP [facility]

## 2021-06-04 ENCOUNTER — Ambulatory Visit: Payer: Self-pay

## 2021-06-04 NOTE — Telephone Encounter (Signed)
?  Chief Complaint: Pt worried about transmitting herpes virus through very casual non contact events. ?Symptoms: none ?Frequency: na ?Pertinent Negatives: Patient denies any herpes breakouts  ?Disposition: [] ED /[] Urgent Care (no appt availability in office) / [] Appointment(In office/virtual)/ []  [place] [facility]/ [] Home Care/ [] Refused Recommended Disposition /[] [place] [facility]/ []  Follow-up with PCP ?Additional Notes: S/w pt. Reviewed guidelines published by [facility]. And .gov webites.  Pt also mentioned that he is having some struggles with mental health which is being addressed. Reminded pt that there are resources available through out the day if needed.  ? ? ?Summary: herpes ?  ? Pt called in and has hsd 1 and 2, he wants to know if he is out with someone and eating if salivia gets on someone they're food or hand, can they catch it.   ?  ? ?Reason for Disposition ? Health Information question, no triage required and triager able to answer question ? ?Answer Assessment - Initial Assessment Questions ?1. REASON FOR CALL or QUESTION: "What is your reason for calling today?" or "How can I best help you?" or "What question do you have that I can help answer?" ?    Pt concerned that he will give herpes to others. ? ?Protocols used: Information Only Call - No Triage-A-AH ? ?

## 2021-06-12 ENCOUNTER — Ambulatory Visit: Payer: BC Managed Care – PPO | Admitting: Nurse Practitioner

## 2021-06-12 ENCOUNTER — Other Ambulatory Visit: Payer: Self-pay | Admitting: Nurse Practitioner

## 2021-06-12 NOTE — Telephone Encounter (Signed)
Requested medication (s) are due for refill today - yes ? ?Requested medication (s) are on the active medication list -yes ? ?Future visit scheduled -no ? ?Last refill: 05/15/21 #30 ? ?Notes to clinic: Request RF: fails lab protocol ? ?Requested Prescriptions  ?Pending Prescriptions Disp Refills  ? venlafaxine XR (EFFEXOR-XR) 37.5 MG 24 hr capsule [Pharmacy Med Name: VENLAFAXINE HCL ER 37.5 MG CAP] 30 capsule 0  ?  Sig: TAKE 1 CAPSULE BY MOUTH DAILY WITH BREAKFAST.  ?  ? Psychiatry: Antidepressants - SNRI - desvenlafaxine & venlafaxine Failed - 06/12/2021  1:43 AM  ?  ?  Failed - Last BP in normal range  ?  BP Readings from Last 1 Encounters:  ?05/15/21 140/90  ?  ?  ?  ?  Failed - Lipid Panel in normal range within the last 12 months  ?  Cholesterol, Total  ?Date Value Ref Range Status  ?08/31/2019 186 100 - 199 mg/dL Final  ? ?LDL Chol Calc (NIH)  ?Date Value Ref Range Status  ?08/31/2019 123 (H) 0 - 99 mg/dL Final  ? ?HDL  ?Date Value Ref Range Status  ?08/31/2019 32 (L) >39 mg/dL Final  ? ?Triglycerides  ?Date Value Ref Range Status  ?08/31/2019 175 (H) 0 - 149 mg/dL Final  ? ?  ?  ?  Passed - Cr in normal range and within 360 days  ?  Creatinine, Ser  ?Date Value Ref Range Status  ?01/02/2021 0.94 0.61 - 1.24 mg/dL Final  ?  ?  ?  ?  Passed - Completed PHQ-2 or PHQ-9 in the last 360 days  ?  ?  Passed - Valid encounter within last 6 months  ?  Recent Outpatient Visits   ? ?      ? 4 weeks ago Anxiety and depression  ? [facility] Larae Grooms, NP  ? 1 month ago MDD (major depressive disorder), recurrent severe, without psychosis (HCC)  ? [facility] Larae Grooms, NP  ? 2 months ago Severe major depression, single episode, with psychotic features (HCC)  ? [facility] Larae Grooms, NP  ? 7 months ago Exposure to herpes simplex virus (HSV)  ? [facility], Corrie Dandy T, NP  ? 1 year ago Swollen tonsil  ? [facility] Particia Nearing, [place]  ? ?  ?  ? ?  ?  ?  ? ? ? ?Requested Prescriptions  ?Pending Prescriptions Disp Refills  ? venlafaxine XR (EFFEXOR-XR) 37.5 MG 24 hr capsule [Pharmacy Med Name: VENLAFAXINE HCL ER 37.5 MG CAP] 30 capsule 0  ?  Sig: TAKE 1 CAPSULE BY MOUTH DAILY WITH BREAKFAST.  ?  ? Psychiatry: Antidepressants - SNRI - desvenlafaxine & venlafaxine Failed - 06/12/2021  1:43 AM  ?  ?  Failed - Last BP in normal range  ?  BP Readings from Last 1 Encounters:  ?05/15/21 140/90  ?  ?  ?  ?  Failed - Lipid Panel in normal range within the last 12 months  ?  Cholesterol, Total  ?Date Value Ref Range Status  ?08/31/2019 186 100 - 199 mg/dL Final  ? ?LDL Chol Calc (NIH)  ?Date Value Ref Range Status  ?08/31/2019 123 (H) 0 - 99 mg/dL Final  ? ?HDL  ?Date Value Ref Range Status  ?08/31/2019 32 (L) >39 mg/dL Final  ? ?Triglycerides  ?Date Value Ref Range Status  ?08/31/2019 175 (H) 0 - 149 mg/dL Final  ? ?  ?  ?  Passed - Cr in normal range and within 360 days  ?  Creatinine, Ser  ?Date Value Ref Range Status  ?01/02/2021 0.94 0.61 - 1.24 mg/dL Final  ?  ?  ?  ?  Passed - Completed PHQ-2 or PHQ-9 in the last 360 days  ?  ?  Passed - Valid encounter within last 6 months  ?  Recent Outpatient Visits   ? ?      ? 4 weeks ago Anxiety and depression  ? [facility] Larae Grooms, NP  ? 1 month ago MDD (major depressive disorder), recurrent severe, without psychosis (HCC)  ? [facility] Larae Grooms, NP  ? 2 months ago Severe major depression, single episode, with psychotic features (HCC)  ? [facility] Larae Grooms, NP  ? 7 months ago Exposure to herpes simplex virus (HSV)  ? [facility] [place], Corrie Dandy T, NP  ? 1 year ago Swollen tonsil  ? [facility] Particia Nearing, [place]  ? ?  ?  ? ?  ?  ?  ? ? ? ?

## 2021-06-19 ENCOUNTER — Telehealth (INDEPENDENT_AMBULATORY_CARE_PROVIDER_SITE_OTHER): Payer: BC Managed Care – PPO | Admitting: Internal Medicine

## 2021-06-19 ENCOUNTER — Encounter: Payer: Self-pay | Admitting: Internal Medicine

## 2021-06-19 ENCOUNTER — Telehealth: Payer: Self-pay

## 2021-06-19 DIAGNOSIS — F419 Anxiety disorder, unspecified: Secondary | ICD-10-CM

## 2021-06-19 DIAGNOSIS — F332 Major depressive disorder, recurrent severe without psychotic features: Secondary | ICD-10-CM | POA: Diagnosis not present

## 2021-06-19 MED ORDER — HYDROXYZINE PAMOATE 25 MG PO CAPS: 25.0000 mg | ORAL_CAPSULE | Freq: Two times a day (BID) | ORAL | 0 refills | Status: DC | PRN

## 2021-06-19 MED ORDER — CITALOPRAM HYDROBROMIDE 10 MG PO TABS: 10.0000 mg | ORAL_TABLET | Freq: Every day | ORAL | 1 refills | Status: DC

## 2021-06-19 NOTE — Progress Notes (Signed)
? ?There were no vitals taken for this visit.  ? ?Subjective:  ? ? Patient ID: William Reid, male    DOB: Jan 08, 1991, 31 y.o.   MRN: [id number] ? ?Chief Complaint  ?Patient presents with  ?? Anxiety  ?  Anxiety and depression have been getting increasingly worse.  And wants to have meds changed  ? ? ?HPI: ?William Reid is a 31 y.o. male ? ? ?This visit was completed via video visit through MyChart due to the restrictions of the COVID-19 pandemic. All issues as above were discussed and addressed. Physical exam was done as above through visual confirmation on video through MyChart. If it was felt that the patient should be evaluated in the office, they were directed there. The patient verbally consented to this visit. ?Location of the patient: home ?Location of the provider: work ?Those involved with this call:  ?Provider: Loura Pardon, MD ?CMA: Tristan Schroeder, CMA ?Front Desk/Registration: [employer]  ?Time spent on call: 10 minutes with patient face to face via video conference. More than 50% of this time was spent in counseling and coordination of care. 10 minutes total spent in review of patient's record and preparation of their chart. ? ?Effexor and straterra isnt working per pt. Has had to call out of work  ?Struggles with OCD and intrusive thoughts went to therapy for 2 yrs  ?Planned on killing himself last October  ?Saw a psychiatrist and wasn't very impressed.  ? ?Anxiety ?Presents for follow-up visit. Symptoms include depressed mood, excessive worry, nervous/anxious behavior, obsessions and panic. Patient reports no chest pain, decreased concentration, dizziness, dry mouth, feeling of choking, hyperventilation, impotence, insomnia, irritability, palpitations, restlessness or suicidal ideas. The severity of symptoms is interfering with daily activities.  ? ? ?Depression ?       This is a chronic problem.  The current episode started 1 to 4 weeks ago.   The onset quality is gradual.   Associated symptoms  include sad.  Associated symptoms include no decreased concentration, no fatigue, no helplessness, no hopelessness, does not have insomnia, not irritable, no restlessness, no decreased interest, no appetite change, no body aches, no myalgias, no headaches, no indigestion and no suicidal ideas.  Past medical history includes anxiety.   ? ?Chief Complaint  ?Patient presents with  ?? Anxiety  ?  Anxiety and depression have been getting increasingly worse.  And wants to have meds changed  ? ? ?Relevant past medical, surgical, family and social history reviewed and updated as indicated. Interim medical history since our last visit reviewed. ?Allergies and medications reviewed and updated. ? ?Review of Systems  ?Constitutional:  Negative for appetite change, fatigue and irritability.  ?Cardiovascular:  Negative for chest pain and palpitations.  ?Genitourinary:  Negative for impotence.  ?Musculoskeletal:  Negative for myalgias.  ?Neurological:  Negative for dizziness and headaches.  ?Psychiatric/Behavioral:  Positive for depression. Negative for decreased concentration and suicidal ideas. The patient is nervous/anxious. The patient does not have insomnia.   ? ?Per HPI unless specifically indicated above ? ?   ?Objective:  ?  ?There were no vitals taken for this visit.  ?Wt Readings from Last 3 Encounters:  ?05/15/21 (!) 344 lb 8 oz (156.3 kg)  ?04/21/21 (!) 352 lb 9.6 oz (159.9 kg)  ?03/19/21 (!) 358 lb (162.4 kg)  ?  ?Physical Exam ?Constitutional:   ?   General: He is not irritable. ? ?Results for orders placed or performed during the hospital encounter of 01/02/21  ?Resp Panel  by RT-PCR (Flu A&B, Covid) Nasopharyngeal Swab  ? Specimen: Nasopharyngeal Swab; Nasopharyngeal(NP) swabs in vial transport medium  ?Result Value Ref Range  ? SARS Coronavirus 2 by RT PCR NEGATIVE NEGATIVE  ? Influenza A by PCR NEGATIVE NEGATIVE  ? Influenza B by PCR NEGATIVE NEGATIVE  ?Comprehensive metabolic panel  ?Result Value Ref Range  ?  Sodium 137 135 - 145 mmol/L  ? Potassium 3.9 3.5 - 5.1 mmol/L  ? Chloride 103 98 - 111 mmol/L  ? CO2 24 22 - 32 mmol/L  ? Glucose, Bld 106 (H) 70 - 99 mg/dL  ? BUN 12 6 - 20 mg/dL  ? Creatinine, Ser 0.94 0.61 - 1.24 mg/dL  ? Calcium 9.5 8.9 - 10.3 mg/dL  ? Total Protein 7.9 6.5 - 8.1 g/dL  ? Albumin 4.6 3.5 - 5.0 g/dL  ? AST 27 15 - 41 U/L  ? ALT 48 (H) 0 - 44 U/L  ? Alkaline Phosphatase 72 38 - 126 U/L  ? Total Bilirubin 0.8 0.3 - 1.2 mg/dL  ? GFR, Estimated >60 >60 mL/min  ? Anion gap 10 5 - 15  ?Ethanol  ?Result Value Ref Range  ? Alcohol, Ethyl (B) <10 <10 mg/dL  ?Salicylate level  ?Result Value Ref Range  ? Salicylate Lvl <7.0 (L) 7.0 - 30.0 mg/dL  ?Acetaminophen level  ?Result Value Ref Range  ? Acetaminophen (Tylenol), Serum <10 (L) 10 - 30 ug/mL  ?cbc  ?Result Value Ref Range  ? WBC 9.5 4.0 - 10.5 K/uL  ? RBC 5.32 4.22 - 5.81 MIL/uL  ? Hemoglobin 16.3 13.0 - 17.0 g/dL  ? HCT 46.3 39.0 - 52.0 %  ? MCV 87.0 80.0 - 100.0 fL  ? MCH 30.6 26.0 - 34.0 pg  ? MCHC 35.2 30.0 - 36.0 g/dL  ? RDW 11.9 11.5 - 15.5 %  ? Platelets 259 150 - 400 K/uL  ? nRBC 0.0 0.0 - 0.2 %  ?Urine Drug Screen, Qualitative  ?Result Value Ref Range  ? Tricyclic, Ur Screen NONE DETECTED NONE DETECTED  ? Amphetamines, Ur Screen NONE DETECTED NONE DETECTED  ? MDMA (Ecstasy)Ur Screen NONE DETECTED NONE DETECTED  ? Cocaine Metabolite,Ur [place] NONE DETECTED NONE DETECTED  ? Opiate, Ur Screen NONE DETECTED NONE DETECTED  ? Phencyclidine (PCP) Ur S NONE DETECTED NONE DETECTED  ? Cannabinoid 50 Ng, Ur [place] NONE DETECTED NONE DETECTED  ? Barbiturates, Ur Screen NONE DETECTED NONE DETECTED  ? Benzodiazepine, Ur Scrn NONE DETECTED NONE DETECTED  ? Methadone Scn, Ur NONE DETECTED NONE DETECTED  ? ?   ? ? ?Current Outpatient Medications:  ??  citalopram (CELEXA) 10 MG tablet, Take 1 tablet (10 mg total) by mouth daily. Start 10 mg x  Increase to 20 mg, Disp: 30 tablet, Rfl: 1 ??  hydrOXYzine (VISTARIL) 25 MG capsule, Take 1 capsule (25 mg total) by mouth every  12 (twelve) hours as needed., Disp: 30 capsule, Rfl: 0  ? ? ?Assessment & Plan:  ?Anxiety / depression : will start pt on celexa stopped taking straterra , feels effexor doesn't help. ?will start celexa 10 mg daily , potential Side effects dw pt. to call office if develops any SE. will fu with pcp on such. ?pt verbalised understanding. ? ?  ? ? ?Problem List Items Addressed This Visit   ?None ?  ? ?No orders of the defined types were placed in this encounter. ?  ? ?Meds ordered this encounter  ?Medications  ?? citalopram (CELEXA) 10 MG tablet  ?  Sig: Take 1 tablet (10 mg total) by mouth daily. Start 10 mg x  ?Increase to 20 mg  ?  Dispense:  30 tablet  ?  Refill:  1  ?? hydrOXYzine (VISTARIL) 25 MG capsule  ?  Sig: Take 1 capsule (25 mg total) by mouth every 12 (twelve) hours as needed.  ?  Dispense:  30 capsule  ?  Refill:  0  ?  ? ?Follow up plan: ?No follow-ups on file. ? ? ?

## 2021-06-19 NOTE — Telephone Encounter (Signed)
Called and informed patient that new prescription that was written today was printed and not sent to his pharmacy and will need to be picked up ?

## 2021-06-25 ENCOUNTER — Ambulatory Visit: Payer: BC Managed Care – PPO | Admitting: Nurse Practitioner

## 2021-06-25 NOTE — Progress Notes (Deleted)
? ?There were no vitals taken for this visit.  ? ?Subjective:  ? ? Patient ID: William Reid, male    DOB: [birth year], 31 y.o.   MRN: [id number] ? ?HPI: ?William Reid is a 31 y.o. male ? ?No chief complaint on file. ? ?DEPRESSION/ANXIETY ?Patient states he doesn't feel like the Effexor is working for him..  States it has worsened each day.  Feels like it has gotten to the point where he doesn't want to be around people or do anything.  Patient states he tried Theatre manager from a friend and it helped his anxiety.   ? ?Flowsheet Row Video Visit from 06/19/2021 in [facility]  ?PHQ-9 Total Score 24  ? ?  ? ? ?  06/19/2021  ?  2:44 PM 05/15/2021  ?  3:33 PM 04/21/2021  ?  3:41 PM 03/19/2021  ?  3:13 PM  ?GAD 7 : Generalized Anxiety Score  ?Nervous, Anxious, on Edge 3 3 2 3   ?Control/stop worrying 3 3 2 3   ?Worry too much - different things 3 3 3 3   ?Trouble relaxing 3 3 2 3   ?Restless 3 3 1 1   ?Easily annoyed or irritable 3 3 2 2   ?Afraid - awful might happen 3 3 2 3   ?Total GAD 7 Score 21 21 14 18   ?Anxiety Difficulty Extremely difficult Somewhat difficult Somewhat difficult Extremely difficult  ? ? ? ?Relevant past medical, surgical, family and social history reviewed and updated as indicated. Interim medical history since our last visit reviewed. ?Allergies and medications reviewed and updated. ? ?Review of Systems  ?Psychiatric/Behavioral:  Positive for dysphoric mood. Negative for suicidal ideas. The patient is nervous/anxious.   ? ?Per HPI unless specifically indicated above ? ?   ?Objective:  ?  ?There were no vitals taken for this visit.  ?Wt Readings from Last 3 Encounters:  ?05/15/21 (!) 344 lb 8 oz (156.3 kg)  ?04/21/21 (!) 352 lb 9.6 oz (159.9 kg)  ?03/19/21 (!) 358 lb (162.4 kg)  ?  ?Physical Exam ?Vitals and nursing note reviewed.  ?Constitutional:   ?   General: He is not in acute distress. ?   Appearance: Normal appearance. He is obese. He is not ill-appearing, toxic-appearing or diaphoretic.   ?HENT:  ?   Head: Normocephalic.  ?   Right Ear: External ear normal.  ?   Left Ear: External ear normal.  ?   Nose: Nose normal. No congestion or rhinorrhea.  ?   Mouth/Throat:  ?   Mouth: Mucous membranes are moist.  ?Eyes:  ?   General:     ?   Right eye: No discharge.     ?   Left eye: No discharge.  ?   Extraocular Movements: Extraocular movements intact.  ?   Conjunctiva/sclera: Conjunctivae normal.  ?   Pupils: Pupils are equal, round, and reactive to light.  ?Cardiovascular:  ?   Rate and Rhythm: Normal rate and regular rhythm.  ?   Heart sounds: No murmur heard. ?Pulmonary:  ?   Effort: Pulmonary effort is normal. No respiratory distress.  ?   Breath sounds: Normal breath sounds. No wheezing, rhonchi or rales.  ?Abdominal:  ?   General: Abdomen is flat. Bowel sounds are normal.  ?Musculoskeletal:  ?   Cervical back: Normal range of motion and neck supple.  ?Skin: ?   General: Skin is warm and dry.  ?   Capillary Refill: Capillary refill takes less than 2 seconds.  ?  Neurological:  ?   General: No focal deficit present.  ?   Mental Status: He is alert and oriented to person, place, and time.  ?Psychiatric:     ?   Mood and Affect: Mood normal.     ?   Behavior: Behavior normal.     ?   Thought Content: Thought content normal.     ?   Judgment: Judgment normal.  ? ? ?Results for orders placed or performed during the hospital encounter of 01/02/21  ?Resp Panel by RT-PCR (Flu A&B, Covid) Nasopharyngeal Swab  ? Specimen: Nasopharyngeal Swab; Nasopharyngeal(NP) swabs in vial transport medium  ?Result Value Ref Range  ? SARS Coronavirus 2 by RT PCR NEGATIVE NEGATIVE  ? Influenza A by PCR NEGATIVE NEGATIVE  ? Influenza B by PCR NEGATIVE NEGATIVE  ?Comprehensive metabolic panel  ?Result Value Ref Range  ? Sodium 137 135 - 145 mmol/L  ? Potassium 3.9 3.5 - 5.1 mmol/L  ? Chloride 103 98 - 111 mmol/L  ? CO2 24 22 - 32 mmol/L  ? Glucose, Bld 106 (H) 70 - 99 mg/dL  ? BUN 12 6 - 20 mg/dL  ? Creatinine, Ser 0.94 0.61 -  1.24 mg/dL  ? Calcium 9.5 8.9 - 10.3 mg/dL  ? Total Protein 7.9 6.5 - 8.1 g/dL  ? Albumin 4.6 3.5 - 5.0 g/dL  ? AST 27 15 - 41 U/L  ? ALT 48 (H) 0 - 44 U/L  ? Alkaline Phosphatase 72 38 - 126 U/L  ? Total Bilirubin 0.8 0.3 - 1.2 mg/dL  ? GFR, Estimated >60 >60 mL/min  ? Anion gap 10 5 - 15  ?Ethanol  ?Result Value Ref Range  ? Alcohol, Ethyl (B) <10 <10 mg/dL  ?Salicylate level  ?Result Value Ref Range  ? Salicylate Lvl <7.0 (L) 7.0 - 30.0 mg/dL  ?Acetaminophen level  ?Result Value Ref Range  ? Acetaminophen (Tylenol), Serum <10 (L) 10 - 30 ug/mL  ?cbc  ?Result Value Ref Range  ? WBC 9.5 4.0 - 10.5 K/uL  ? RBC 5.32 4.22 - 5.81 MIL/uL  ? Hemoglobin 16.3 13.0 - 17.0 g/dL  ? HCT 46.3 39.0 - 52.0 %  ? MCV 87.0 80.0 - 100.0 fL  ? MCH 30.6 26.0 - 34.0 pg  ? MCHC 35.2 30.0 - 36.0 g/dL  ? RDW 11.9 11.5 - 15.5 %  ? Platelets 259 150 - 400 K/uL  ? nRBC 0.0 0.0 - 0.2 %  ?Urine Drug Screen, Qualitative  ?Result Value Ref Range  ? Tricyclic, Ur Screen NONE DETECTED NONE DETECTED  ? Amphetamines, Ur Screen NONE DETECTED NONE DETECTED  ? MDMA (Ecstasy)Ur Screen NONE DETECTED NONE DETECTED  ? Cocaine Metabolite,Ur [place] NONE DETECTED NONE DETECTED  ? Opiate, Ur Screen NONE DETECTED NONE DETECTED  ? Phencyclidine (PCP) Ur S NONE DETECTED NONE DETECTED  ? Cannabinoid 50 Ng, Ur [place] NONE DETECTED NONE DETECTED  ? Barbiturates, Ur Screen NONE DETECTED NONE DETECTED  ? Benzodiazepine, Ur Scrn NONE DETECTED NONE DETECTED  ? Methadone Scn, Ur NONE DETECTED NONE DETECTED  ? ?   ?Assessment & Plan:  ? ?Problem List Items Addressed This Visit   ? ?  ? Other  ? Anxiety and depression  ? Generalized anxiety disorder - Primary  ?  ? ?Follow up plan: ?No follow-ups on file. ? ? ? ? ? ?

## 2021-07-03 ENCOUNTER — Encounter: Payer: Self-pay | Admitting: Internal Medicine

## 2021-07-03 DIAGNOSIS — F419 Anxiety disorder, unspecified: Secondary | ICD-10-CM | POA: Insufficient documentation

## 2021-08-08 ENCOUNTER — Ambulatory Visit (INDEPENDENT_AMBULATORY_CARE_PROVIDER_SITE_OTHER): Payer: BC Managed Care – PPO | Admitting: Nurse Practitioner

## 2021-08-08 ENCOUNTER — Encounter: Payer: Self-pay | Admitting: Nurse Practitioner

## 2021-08-08 DIAGNOSIS — F332 Major depressive disorder, recurrent severe without psychotic features: Secondary | ICD-10-CM | POA: Diagnosis not present

## 2021-08-08 MED ORDER — FLUOXETINE HCL 40 MG PO CAPS: 40.0000 mg | ORAL_CAPSULE | Freq: Every day | ORAL | 1 refills | Status: DC

## 2021-08-08 MED ORDER — LURASIDONE HCL 20 MG PO TABS: 20.0000 mg | ORAL_TABLET | Freq: Every day | ORAL | 0 refills | Status: DC

## 2021-08-08 NOTE — Progress Notes (Signed)
? ?BP 136/89 (BP Location: Right Arm)   Pulse 75   Temp 98.3 ?F (36.8 ?C) (Oral)   Wt (!) 356 lb 12.8 oz (161.8 kg)   SpO2 95%   BMI 45.79 kg/m?   ? ?Subjective:  ? ? Patient ID: William Reid, male    DOB: 1990-09-10, 31 y.o.   MRN: [id number] ? ?HPI: ?William Reid is a 31 y.o. male ? ?Chief Complaint  ?Patient presents with  ? Medication Dose Change  ?  Patient would like to discuss increasing dose on meds  ?Hydroxizine is not helping per patient, pt would like to discuss different options   ? ?DEPRESSION/ANXIETY ?Patient states he doesn't feel like the medications are working as well as they were when he left the hospital.  States today has been a really hard day.   Some SI but does not plan to act on them.  Has no plan to commit suicide. ? ?He is working with a Paramedic. He was not set up with a psychiatry appt after his hospitalization.  He is also working on group therapy.  ? ?Flowsheet Row Office Visit from 08/08/2021 in [facility]  ?PHQ-9 Total Score 18  ? ?  ? ? ?  08/08/2021  ?  3:53 PM 06/19/2021  ?  2:44 PM 05/15/2021  ?  3:33 PM 04/21/2021  ?  3:41 PM  ?GAD 7 : Generalized Anxiety Score  ?Nervous, Anxious, on Edge 3 3 3 2   ?Control/stop worrying 3 3 3 2   ?Worry too much - different things 2 3 3 3   ?Trouble relaxing 3 3 3 2   ?Restless 3 3 3 1   ?Easily annoyed or irritable 3 3 3 2   ?Afraid - awful might happen 3 3 3 2   ?Total GAD 7 Score 20 21 21 14   ?Anxiety Difficulty Somewhat difficult Extremely difficult Somewhat difficult Somewhat difficult  ? ? ? ?Relevant past medical, surgical, family and social history reviewed and updated as indicated. Interim medical history since our last visit reviewed. ?Allergies and medications reviewed and updated. ? ?Review of Systems  ?Psychiatric/Behavioral:  Positive for dysphoric mood. Negative for suicidal ideas. The patient is nervous/anxious.   ? ?Per HPI unless specifically indicated above ? ?   ?Objective:  ?  ?BP 136/89 (BP Location: Right Arm)    Pulse 75   Temp 98.3 ?F (36.8 ?C) (Oral)   Wt (!) 356 lb 12.8 oz (161.8 kg)   SpO2 95%   BMI 45.79 kg/m?   ?Wt Readings from Last 3 Encounters:  ?08/08/21 (!) 356 lb 12.8 oz (161.8 kg)  ?05/15/21 (!) 344 lb 8 oz (156.3 kg)  ?04/21/21 (!) 352 lb 9.6 oz (159.9 kg)  ?  ?Physical Exam ?Vitals and nursing note reviewed.  ?Constitutional:   ?   General: He is not in acute distress. ?   Appearance: Normal appearance. He is obese. He is not ill-appearing, toxic-appearing or diaphoretic.  ?HENT:  ?   Head: Normocephalic.  ?   Right Ear: External ear normal.  ?   Left Ear: External ear normal.  ?   Nose: Nose normal. No congestion or rhinorrhea.  ?   Mouth/Throat:  ?   Mouth: Mucous membranes are moist.  ?Eyes:  ?   General:     ?   Right eye: No discharge.     ?   Left eye: No discharge.  ?   Extraocular Movements: Extraocular movements intact.  ?   Conjunctiva/sclera: Conjunctivae normal.  ?  Pupils: Pupils are equal, round, and reactive to light.  ?Cardiovascular:  ?   Rate and Rhythm: Normal rate and regular rhythm.  ?   Heart sounds: No murmur heard. ?Pulmonary:  ?   Effort: Pulmonary effort is normal. No respiratory distress.  ?   Breath sounds: Normal breath sounds. No wheezing, rhonchi or rales.  ?Abdominal:  ?   General: Abdomen is flat. Bowel sounds are normal.  ?Musculoskeletal:  ?   Cervical back: Normal range of motion and neck supple.  ?Skin: ?   General: Skin is warm and dry.  ?   Capillary Refill: Capillary refill takes less than 2 seconds.  ?Neurological:  ?   General: No focal deficit present.  ?   Mental Status: He is alert and oriented to person, place, and time.  ?Psychiatric:     ?   Mood and Affect: Mood normal.     ?   Behavior: Behavior normal.     ?   Thought Content: Thought content normal.     ?   Judgment: Judgment normal.  ? ? ?Results for orders placed or performed during the hospital encounter of 01/02/21  ?Resp Panel by RT-PCR (Flu A&B, Covid) Nasopharyngeal Swab  ? Specimen:  Nasopharyngeal Swab; Nasopharyngeal(NP) swabs in vial transport medium  ?Result Value Ref Range  ? SARS Coronavirus 2 by RT PCR NEGATIVE NEGATIVE  ? Influenza A by PCR NEGATIVE NEGATIVE  ? Influenza B by PCR NEGATIVE NEGATIVE  ?Comprehensive metabolic panel  ?Result Value Ref Range  ? Sodium 137 135 - 145 mmol/L  ? Potassium 3.9 3.5 - 5.1 mmol/L  ? Chloride 103 98 - 111 mmol/L  ? CO2 24 22 - 32 mmol/L  ? Glucose, Bld 106 (H) 70 - 99 mg/dL  ? BUN 12 6 - 20 mg/dL  ? Creatinine, Ser 0.94 0.61 - 1.24 mg/dL  ? Calcium 9.5 8.9 - 10.3 mg/dL  ? Total Protein 7.9 6.5 - 8.1 g/dL  ? Albumin 4.6 3.5 - 5.0 g/dL  ? AST 27 15 - 41 U/L  ? ALT 48 (H) 0 - 44 U/L  ? Alkaline Phosphatase 72 38 - 126 U/L  ? Total Bilirubin 0.8 0.3 - 1.2 mg/dL  ? GFR, Estimated >60 >60 mL/min  ? Anion gap 10 5 - 15  ?Ethanol  ?Result Value Ref Range  ? Alcohol, Ethyl (B) <10 <10 mg/dL  ?Salicylate level  ?Result Value Ref Range  ? Salicylate Lvl <7.0 (L) 7.0 - 30.0 mg/dL  ?Acetaminophen level  ?Result Value Ref Range  ? Acetaminophen (Tylenol), Serum <10 (L) 10 - 30 ug/mL  ?cbc  ?Result Value Ref Range  ? WBC 9.5 4.0 - 10.5 K/uL  ? RBC 5.32 4.22 - 5.81 MIL/uL  ? Hemoglobin 16.3 13.0 - 17.0 g/dL  ? HCT 46.3 39.0 - 52.0 %  ? MCV 87.0 80.0 - 100.0 fL  ? MCH 30.6 26.0 - 34.0 pg  ? MCHC 35.2 30.0 - 36.0 g/dL  ? RDW 11.9 11.5 - 15.5 %  ? Platelets 259 150 - 400 K/uL  ? nRBC 0.0 0.0 - 0.2 %  ?Urine Drug Screen, Qualitative  ?Result Value Ref Range  ? Tricyclic, Ur Screen NONE DETECTED NONE DETECTED  ? Amphetamines, Ur Screen NONE DETECTED NONE DETECTED  ? MDMA (Ecstasy)Ur Screen NONE DETECTED NONE DETECTED  ? Cocaine Metabolite,Ur [place] NONE DETECTED NONE DETECTED  ? Opiate, Ur Screen NONE DETECTED NONE DETECTED  ? Phencyclidine (PCP) Ur S NONE DETECTED NONE DETECTED  ?  Cannabinoid 50 Ng, Ur  NONE DETECTED NONE DETECTED  ? Barbiturates, Ur Screen NONE DETECTED NONE DETECTED  ? Benzodiazepine, Ur Scrn NONE DETECTED NONE DETECTED  ? Methadone Scn, Ur NONE DETECTED  NONE DETECTED  ? ?   ?Assessment & Plan:  ? ?Problem List Items Addressed This Visit   ? ?  ? Other  ? MDD (major depressive disorder), recurrent severe, without psychosis (HCC)  ?  Chronic. Not well controlled.  Will give patient referral to psychiatry and trauma psychology.  Will increase Prozac to 40mg .  Will also add Latuda due to uncontrolled symptoms.  Continue with Therapy and group therapy.  Is having thoughts of suicide but denies a plan to act on them. Follow up in 1 weeks for reevaluation. Call sooner if concerns arise. ? ?  ?  ? Relevant Medications  ? FLUoxetine (PROZAC) 40 MG capsule  ?  ? ?Follow up plan: ?Return in about 1 week (around 08/15/2021) for Depression/Anxiety FU. ? ? ? ? ? ?

## 2021-08-08 NOTE — Assessment & Plan Note (Addendum)
Chronic. Not well controlled.  Will give patient referral to psychiatry and trauma psychology.  Will increase Prozac to 40mg .  Will also add Latuda due to uncontrolled symptoms.  Continue with Therapy and group therapy.  Is having thoughts of suicide but denies a plan to act on them. Follow up in 1 weeks for reevaluation. Call sooner if concerns arise. ?

## 2021-08-11 ENCOUNTER — Telehealth: Payer: Self-pay | Admitting: Nurse Practitioner

## 2021-08-11 MED ORDER — ARIPIPRAZOLE 2 MG PO TABS: 2.0000 mg | ORAL_TABLET | Freq: Every day | ORAL | 0 refills | Status: DC

## 2021-08-11 NOTE — Telephone Encounter (Signed)
PA initiated for Latuda through covermymeds, awaiting determination.  ? ?Key: [id number] ?

## 2021-08-11 NOTE — Telephone Encounter (Signed)
Copied from CRM [id number]. Topic: General - Other ?>> Aug 11, 2021  8:55 AM Traci Sermon wrote: ?Reason for CRM: Pt called in stating his insurance reached out to him about medication, lurasidone (LATUDA) 20 MG TABS tablet, stating they were needing more information from PCP to make sure they can cover it, so he can get it refilled, please advise. ?

## 2021-08-11 NOTE — Telephone Encounter (Signed)
PA for William Reid has been denied, alternative could be :  ?A) aripiprazole, B) olanzapine, C) paliperidone, ?D) quetiapine, E) quetiapine extended release, F) risperidone, G) ziprasidone ? ? ?Please advise.  ?

## 2021-08-11 NOTE — Telephone Encounter (Signed)
Spoke with patient regarding abilify sent to pharmacy, pt voiced understanding.  ?

## 2021-08-11 NOTE — Addendum Note (Signed)
Addended by: Larae Grooms on: 08/11/2021 02:37 PM ? ? Modules accepted: Orders ? ?

## 2021-08-15 ENCOUNTER — Encounter: Payer: Self-pay | Admitting: Nurse Practitioner

## 2021-08-15 ENCOUNTER — Ambulatory Visit: Payer: BC Managed Care – PPO | Admitting: Nurse Practitioner

## 2021-08-15 VITALS — BP 159/93 | HR 82 | Temp 98.3°F | Wt 356.6 lb

## 2021-08-15 DIAGNOSIS — F323 Major depressive disorder, single episode, severe with psychotic features: Secondary | ICD-10-CM | POA: Diagnosis not present

## 2021-08-15 DIAGNOSIS — F4310 Post-traumatic stress disorder, unspecified: Secondary | ICD-10-CM

## 2021-08-15 MED ORDER — ARIPIPRAZOLE 2 MG PO TABS: 2.0000 mg | ORAL_TABLET | Freq: Every day | ORAL | 0 refills | Status: DC

## 2021-08-15 NOTE — Progress Notes (Signed)
BP (!) 159/93   Pulse 82   Temp 98.3 F (36.8 C) (Oral)   Wt (!) 356 lb 9.6 oz (161.8 kg)   SpO2 97%   BMI 45.76 kg/m    Subjective:    Patient ID: William Reid, male    DOB: Mar 25, 1991, 31 y.o.   MRN: [id number]  HPI: William Reid is a 31 y.o. male  Chief Complaint  Patient presents with   Anxiety    1 week follow up - pt has noticed more headaches than usual- not sure if this is with the new medication that has been prescribed.    Depression   DEPRESSION/ANXIETY Patient states he doesn't feel like the medications is working well for him.  He is having some headaches but has only been on the medication for 1 weeks.  He feels like he has a lot more clarity than he was having before.    Patient states he is having less frequent thoughts of suicide.  Denies having a plan to harm himself.    He is working with a Transport planner. He was not set up with a psychiatry appt after his hospitalization.  He is also working on group therapy.   [place] Office Visit from 08/15/2021 in [place]  PHQ-9 Total Score 9         08/15/2021    4:38 PM 08/08/2021    3:53 PM 06/19/2021    2:44 PM 05/15/2021    3:33 PM  GAD 7 : Generalized Anxiety Score  Nervous, Anxious, on Edge 1 3 3 3   Control/stop worrying 1 3 3 3   Worry too much - different things 1 2 3 3   Trouble relaxing 1 3 3 3   Restless 1 3 3 3   Easily annoyed or irritable 1 3 3 3   Afraid - awful might happen 1 3 3 3   Total GAD 7 Score 7 20 21 21   Anxiety Difficulty Somewhat difficult Somewhat difficult Extremely difficult Somewhat difficult     Relevant past medical, surgical, family and social history reviewed and updated as indicated. Interim medical history since our last visit reviewed. Allergies and medications reviewed and updated.  Review of Systems  Psychiatric/Behavioral:  Positive for dysphoric mood and suicidal ideas. The patient is nervous/anxious.    Per HPI unless specifically indicated  above     Objective:    BP (!) 159/93   Pulse 82   Temp 98.3 F (36.8 C) (Oral)   Wt (!) 356 lb 9.6 oz (161.8 kg)   SpO2 97%   BMI 45.76 kg/m   Wt Readings from Last 3 Encounters:  08/15/21 (!) 356 lb 9.6 oz (161.8 kg)  08/08/21 (!) 356 lb 12.8 oz (161.8 kg)  05/15/21 (!) 344 lb 8 oz (156.3 kg)    Physical Exam Vitals and nursing note reviewed.  Constitutional:      General: He is not in acute distress.    Appearance: Normal appearance. He is obese. He is not ill-appearing, toxic-appearing or diaphoretic.  HENT:     Head: Normocephalic.     Right Ear: External ear normal.     Left Ear: External ear normal.     Nose: Nose normal. No congestion or rhinorrhea.     Mouth/Throat:     Mouth: Mucous membranes are moist.  Eyes:     General:        Right eye: No discharge.        Left eye: No discharge.  Extraocular Movements: Extraocular movements intact.     Conjunctiva/sclera: Conjunctivae normal.     Pupils: Pupils are equal, round, and reactive to light.  Cardiovascular:     Rate and Rhythm: Normal rate and regular rhythm.     Heart sounds: No murmur heard. Pulmonary:     Effort: Pulmonary effort is normal. No respiratory distress.     Breath sounds: Normal breath sounds. No wheezing, rhonchi or rales.  Abdominal:     General: Abdomen is flat. Bowel sounds are normal.  Musculoskeletal:     Cervical back: Normal range of motion and neck supple.  Skin:    General: Skin is warm and dry.     Capillary Refill: Capillary refill takes less than 2 seconds.  Neurological:     General: No focal deficit present.     Mental Status: He is alert and oriented to person, place, and time.  Psychiatric:        Mood and Affect: Mood normal.        Behavior: Behavior normal.        Thought Content: Thought content normal.        Judgment: Judgment normal.    Results for orders placed or performed during the hospital encounter of 01/02/21  Resp Panel by RT-PCR (Flu A&B, Covid)  Nasopharyngeal Swab   Specimen: Nasopharyngeal Swab; Nasopharyngeal(NP) swabs in vial transport medium  Result Value Ref Range   SARS Coronavirus 2 by RT PCR NEGATIVE NEGATIVE   Influenza A by PCR NEGATIVE NEGATIVE   Influenza B by PCR NEGATIVE NEGATIVE  Comprehensive metabolic panel  Result Value Ref Range   Sodium 137 135 - 145 mmol/L   Potassium 3.9 3.5 - 5.1 mmol/L   Chloride 103 98 - 111 mmol/L   CO2 24 22 - 32 mmol/L   Glucose, Bld 106 (H) 70 - 99 mg/dL   BUN 12 6 - 20 mg/dL   Creatinine, Ser 0.94 0.61 - 1.24 mg/dL   Calcium 9.5 8.9 - 10.3 mg/dL   Total Protein 7.9 6.5 - 8.1 g/dL   Albumin 4.6 3.5 - 5.0 g/dL   AST 27 15 - 41 U/L   ALT 48 (H) 0 - 44 U/L   Alkaline Phosphatase 72 38 - 126 U/L   Total Bilirubin 0.8 0.3 - 1.2 mg/dL   GFR, Estimated >60 >60 mL/min   Anion gap 10 5 - 15  Ethanol  Result Value Ref Range   Alcohol, Ethyl (B) Q000111Q Q000111Q mg/dL  Salicylate level  Result Value Ref Range   Salicylate Lvl Q000111Q (L) 7.0 - 30.0 mg/dL  Acetaminophen level  Result Value Ref Range   Acetaminophen (Tylenol), Serum <10 (L) 10 - 30 ug/mL  cbc  Result Value Ref Range   WBC 9.5 4.0 - 10.5 K/uL   RBC 5.32 4.22 - 5.81 MIL/uL   Hemoglobin 16.3 13.0 - 17.0 g/dL   HCT 46.3 39.0 - 52.0 %   MCV 87.0 80.0 - 100.0 fL   MCH 30.6 26.0 - 34.0 pg   MCHC 35.2 30.0 - 36.0 g/dL   RDW 11.9 11.5 - 15.5 %   Platelets 259 150 - 400 K/uL   nRBC 0.0 0.0 - 0.2 %  Urine Drug Screen, Qualitative  Result Value Ref Range   Tricyclic, Ur Screen NONE DETECTED NONE DETECTED   Amphetamines, Ur Screen NONE DETECTED NONE DETECTED   MDMA (Ecstasy)Ur Screen NONE DETECTED NONE DETECTED   Cocaine Metabolite,Ur [place] NONE DETECTED NONE DETECTED   Opiate,  Ur Screen NONE DETECTED NONE DETECTED   Phencyclidine (PCP) Ur S NONE DETECTED NONE DETECTED   Cannabinoid 50 Ng, Ur [place] NONE DETECTED NONE DETECTED   Barbiturates, Ur Screen NONE DETECTED NONE DETECTED   Benzodiazepine, Ur Scrn NONE DETECTED NONE DETECTED    Methadone Scn, Ur NONE DETECTED NONE DETECTED      Assessment & Plan:   Problem List Items Addressed This Visit       Other   Severe major depression, single episode, with psychotic features ([place]) - Primary    Chronic. Improving.  Will continue with Abilify 2mg  for another week.  If still tolerating medication well increase to 2 tabs daily.  Follow up in 2 weeks for reevaluation.  Call sooner if concerns arise. Will place referral for psychiatry.         Relevant Orders   Ambulatory referral to Psychiatry   Ambulatory referral to Psychology   PTSD (post-traumatic stress disorder)    Chronic. Believe patient will benefit from Trauma therapy.  Will place referral to get patient set up with this.         Relevant Orders   Ambulatory referral to Psychiatry   Ambulatory referral to Psychology     Follow up plan: Return in about 2 weeks (around 08/29/2021) for Depression/Anxiety FU.

## 2021-08-15 NOTE — Assessment & Plan Note (Signed)
Chronic. Improving.  Will continue with Abilify 2mg  for another week.  If still tolerating medication well increase to 2 tabs daily.  Follow up in 2 weeks for reevaluation.  Call sooner if concerns arise. Will place referral for psychiatry.

## 2021-08-15 NOTE — Assessment & Plan Note (Signed)
Chronic. Believe patient will benefit from Trauma therapy.  Will place referral to get patient set up with this.

## 2021-09-03 ENCOUNTER — Ambulatory Visit (INDEPENDENT_AMBULATORY_CARE_PROVIDER_SITE_OTHER): Payer: BC Managed Care – PPO | Admitting: Nurse Practitioner

## 2021-09-03 ENCOUNTER — Encounter: Payer: Self-pay | Admitting: Nurse Practitioner

## 2021-09-03 VITALS — BP 153/94 | HR 73 | Temp 98.2°F | Wt 370.0 lb

## 2021-09-03 DIAGNOSIS — F4310 Post-traumatic stress disorder, unspecified: Secondary | ICD-10-CM

## 2021-09-03 DIAGNOSIS — F323 Major depressive disorder, single episode, severe with psychotic features: Secondary | ICD-10-CM | POA: Diagnosis not present

## 2021-09-03 MED ORDER — BUSPIRONE HCL 5 MG PO TABS: 5.0000 mg | ORAL_TABLET | Freq: Two times a day (BID) | ORAL | 0 refills | Status: DC

## 2021-09-03 MED ORDER — ARIPIPRAZOLE 5 MG PO TABS: 5.0000 mg | ORAL_TABLET | Freq: Every day | ORAL | 1 refills | Status: DC

## 2021-09-03 NOTE — Assessment & Plan Note (Addendum)
Chronic. Improved from prior. Patient currently taking Abilify 4mg . Will increase to 5mg  so he only has to take 1 pill daily.  Follow up in 1 month for reevaluation .

## 2021-09-03 NOTE — Progress Notes (Signed)
BP (!) 153/94   Pulse 73   Temp 98.2 F (36.8 C) (Oral)   Wt (!) 370 lb (167.8 kg)   SpO2 98%   BMI 47.48 kg/m    Subjective:    Patient ID: William LownMichael K Reid, male    DOB: 01/21/1991, 31 y.o.   MRN: [id number]  HPI: William Reid is a 31 y.o. male  Chief Complaint  Patient presents with   Depression   Anxiety    Patient states doing well , pt states medication works very well with him but would like to discuss medication for anxiety .    DEPRESSION/ANXIETY Patient states he really likes the Abilify.  He is feeling a lot better.  He does feel like his anxiety is up.  He would like to be more even with his mood.  He heard from the therapist about the trauma therapy.  He is hesitant to see the new therapist because he is not ready to talk about his trauma right now.   Flowsheet Row Office Visit from 09/03/2021 in [facility]  PHQ-9 Total Score 2         09/03/2021    3:38 PM 08/15/2021    4:38 PM 08/08/2021    3:53 PM 06/19/2021    2:44 PM  GAD 7 : Generalized Anxiety Score  Nervous, Anxious, on Edge 1 1 3 3   Control/stop worrying 0 1 3 3   Worry too much - different things 0 1 2 3   Trouble relaxing 0 1 3 3   Restless 0 1 3 3   Easily annoyed or irritable 1 1 3 3   Afraid - awful might happen 0 1 3 3   Total GAD 7 Score 2 7 20 21   Anxiety Difficulty Somewhat difficult Somewhat difficult Somewhat difficult Extremely difficult     Relevant past medical, surgical, family and social history reviewed and updated as indicated. Interim medical history since our last visit reviewed. Allergies and medications reviewed and updated.  Review of Systems  Psychiatric/Behavioral:  Positive for dysphoric mood. Negative for suicidal ideas. The patient is nervous/anxious.    Per HPI unless specifically indicated above     Objective:    BP (!) 153/94   Pulse 73   Temp 98.2 F (36.8 C) (Oral)   Wt (!) 370 lb (167.8 kg)   SpO2 98%   BMI 47.48 kg/m   Wt Readings from  Last 3 Encounters:  09/03/21 (!) 370 lb (167.8 kg)  08/15/21 (!) 356 lb 9.6 oz (161.8 kg)  08/08/21 (!) 356 lb 12.8 oz (161.8 kg)    Physical Exam Vitals and nursing note reviewed.  Constitutional:      General: He is not in acute distress.    Appearance: Normal appearance. He is obese. He is not ill-appearing, toxic-appearing or diaphoretic.  HENT:     Head: Normocephalic.     Right Ear: External ear normal.     Left Ear: External ear normal.     Nose: Nose normal. No congestion or rhinorrhea.     Mouth/Throat:     Mouth: Mucous membranes are moist.  Eyes:     General:        Right eye: No discharge.        Left eye: No discharge.     Extraocular Movements: Extraocular movements intact.     Conjunctiva/sclera: Conjunctivae normal.     Pupils: Pupils are equal, round, and reactive to light.  Cardiovascular:     Rate and Rhythm:  Normal rate and regular rhythm.     Heart sounds: No murmur heard. Pulmonary:     Effort: Pulmonary effort is normal. No respiratory distress.     Breath sounds: Normal breath sounds. No wheezing, rhonchi or rales.  Abdominal:     General: Abdomen is flat. Bowel sounds are normal.  Musculoskeletal:     Cervical back: Normal range of motion and neck supple.  Skin:    General: Skin is warm and dry.     Capillary Refill: Capillary refill takes less than 2 seconds.  Neurological:     General: No focal deficit present.     Mental Status: He is alert and oriented to person, place, and time.  Psychiatric:        Mood and Affect: Mood normal.        Behavior: Behavior normal.        Thought Content: Thought content normal.        Judgment: Judgment normal.    Results for orders placed or performed during the hospital encounter of 01/02/21  Resp Panel by RT-PCR (Flu A&B, Covid) Nasopharyngeal Swab   Specimen: Nasopharyngeal Swab; Nasopharyngeal(NP) swabs in vial transport medium  Result Value Ref Range   SARS Coronavirus 2 by RT PCR NEGATIVE NEGATIVE    Influenza A by PCR NEGATIVE NEGATIVE   Influenza B by PCR NEGATIVE NEGATIVE  Comprehensive metabolic panel  Result Value Ref Range   Sodium 137 135 - 145 mmol/L   Potassium 3.9 3.5 - 5.1 mmol/L   Chloride 103 98 - 111 mmol/L   CO2 24 22 - 32 mmol/L   Glucose, Bld 106 (H) 70 - 99 mg/dL   BUN 12 6 - 20 mg/dL   Creatinine, Ser 7.09 0.61 - 1.24 mg/dL   Calcium 9.5 8.9 - 62.8 mg/dL   Total Protein 7.9 6.5 - 8.1 g/dL   Albumin 4.6 3.5 - 5.0 g/dL   AST 27 15 - 41 U/L   ALT 48 (H) 0 - 44 U/L   Alkaline Phosphatase 72 38 - 126 U/L   Total Bilirubin 0.8 0.3 - 1.2 mg/dL   GFR, Estimated >36 >62 mL/min   Anion gap 10 5 - 15  Ethanol  Result Value Ref Range   Alcohol, Ethyl (B) <10 <10 mg/dL  Salicylate level  Result Value Ref Range   Salicylate Lvl <7.0 (L) 7.0 - 30.0 mg/dL  Acetaminophen level  Result Value Ref Range   Acetaminophen (Tylenol), Serum <10 (L) 10 - 30 ug/mL  cbc  Result Value Ref Range   WBC 9.5 4.0 - 10.5 K/uL   RBC 5.32 4.22 - 5.81 MIL/uL   Hemoglobin 16.3 13.0 - 17.0 g/dL   HCT 94.7 65.4 - 65.0 %   MCV 87.0 80.0 - 100.0 fL   MCH 30.6 26.0 - 34.0 pg   MCHC 35.2 30.0 - 36.0 g/dL   RDW 35.4 65.6 - 81.2 %   Platelets 259 150 - 400 K/uL   nRBC 0.0 0.0 - 0.2 %  Urine Drug Screen, Qualitative  Result Value Ref Range   Tricyclic, Ur Screen NONE DETECTED NONE DETECTED   Amphetamines, Ur Screen NONE DETECTED NONE DETECTED   MDMA (Ecstasy)Ur Screen NONE DETECTED NONE DETECTED   Cocaine Metabolite,Ur [place] NONE DETECTED NONE DETECTED   Opiate, Ur Screen NONE DETECTED NONE DETECTED   Phencyclidine (PCP) Ur S NONE DETECTED NONE DETECTED   Cannabinoid 50 Ng, Ur  NONE DETECTED NONE DETECTED   Barbiturates, Ur Screen NONE DETECTED  NONE DETECTED   Benzodiazepine, Ur Scrn NONE DETECTED NONE DETECTED   Methadone Scn, Ur NONE DETECTED NONE DETECTED      Assessment & Plan:   Problem List Items Addressed This Visit       Other   Severe major depression, single episode,  with psychotic features ([facility]) - Primary    Chronic. Improved from prior. Patient currently taking Abilify 4mg . Will increase to 5mg  so he only has to take 1 pill daily.  Follow up in 1 month for reevaluation .       Relevant Medications   busPIRone (BUSPAR) 5 MG tablet   PTSD (post-traumatic stress disorder)    Chronic. Having anxiety.  Will start Buspar 5mg  BID PRN. Side effects and benefits of medication discussed during visit.  Follow up in 1 month for reevaluation.       Relevant Medications   busPIRone (BUSPAR) 5 MG tablet     Follow up plan: Return in about 1 month (around 10/03/2021) for Depression/Anxiety FU.

## 2021-09-03 NOTE — Assessment & Plan Note (Signed)
Chronic. Having anxiety.  Will start Buspar 5mg  BID PRN. Side effects and benefits of medication discussed during visit.  Follow up in 1 month for reevaluation.

## 2021-10-02 ENCOUNTER — Other Ambulatory Visit: Payer: Self-pay | Admitting: Nurse Practitioner

## 2021-10-02 NOTE — Telephone Encounter (Signed)
Requested Prescriptions  Pending Prescriptions Disp Refills  . busPIRone (BUSPAR) 5 MG tablet [Pharmacy Med Name: BUSPIRONE HCL 5 MG TABLET] 180 tablet 0    Sig: TAKE 1 TABLET BY MOUTH TWICE A DAY     Psychiatry: Anxiolytics/Hypnotics - Non-controlled Passed - 10/02/2021  1:49 AM      Passed - Valid encounter within last 12 months    Recent Outpatient Visits          4 weeks ago Severe major depression, single episode, with psychotic features (HCC)   [facility] Larae Grooms, NP   1 month ago Severe major depression, single episode, with psychotic features (HCC)   [facility] Larae Grooms, NP   1 month ago MDD (major depressive disorder), recurrent severe, without psychosis (HCC)   [facility] Larae Grooms, NP   3 months ago Anxiety   [facility] Vigg, Avanti, MD   4 months ago Anxiety and depression   [facility] Larae Grooms, NP      Future Appointments            Tomorrow Larae Grooms, NP [facility]

## 2021-10-03 ENCOUNTER — Ambulatory Visit: Payer: BC Managed Care – PPO | Admitting: Nurse Practitioner

## 2021-10-03 NOTE — Progress Notes (Deleted)
There were no vitals taken for this visit.   Subjective:    Patient ID: William Reid, male    DOB: 03/03/1991, 31 y.o.   MRN: [id number]  HPI: William Reid is a 31 y.o. male  No chief complaint on file.  DEPRESSION/ANXIETY Patient states he really likes the Abilify.  He is feeling a lot better.  He does feel like his anxiety is up.  He would like to be more even with his mood.  He heard from the therapist about the trauma therapy.  He is hesitant to see the new therapist because he is not ready to talk about his trauma right now.   Flowsheet Row Office Visit from 09/03/2021 in [facility]  PHQ-9 Total Score 2         09/03/2021    3:38 PM 08/15/2021    4:38 PM 08/08/2021    3:53 PM 06/19/2021    2:44 PM  GAD 7 : Generalized Anxiety Score  Nervous, Anxious, on Edge 1 1 3 3   Control/stop worrying 0 1 3 3   Worry too much - different things 0 1 2 3   Trouble relaxing 0 1 3 3   Restless 0 1 3 3   Easily annoyed or irritable 1 1 3 3   Afraid - awful might happen 0 1 3 3   Total GAD 7 Score 2 7 20 21   Anxiety Difficulty Somewhat difficult Somewhat difficult Somewhat difficult Extremely difficult     Relevant past medical, surgical, family and social history reviewed and updated as indicated. Interim medical history since our last visit reviewed. Allergies and medications reviewed and updated.  Review of Systems  Psychiatric/Behavioral:  Positive for dysphoric mood. Negative for suicidal ideas. The patient is nervous/anxious.    Per HPI unless specifically indicated above     Objective:    There were no vitals taken for this visit.  Wt Readings from Last 3 Encounters:  09/03/21 (!) 370 lb (167.8 kg)  08/15/21 (!) 356 lb 9.6 oz (161.8 kg)  08/08/21 (!) 356 lb 12.8 oz (161.8 kg)    Physical Exam Vitals and nursing note reviewed.  Constitutional:      General: He is not in acute distress.    Appearance: Normal appearance. He is obese. He is not ill-appearing,  toxic-appearing or diaphoretic.  HENT:     Head: Normocephalic.     Right Ear: External ear normal.     Left Ear: External ear normal.     Nose: Nose normal. No congestion or rhinorrhea.     Mouth/Throat:     Mouth: Mucous membranes are moist.  Eyes:     General:        Right eye: No discharge.        Left eye: No discharge.     Extraocular Movements: Extraocular movements intact.     Conjunctiva/sclera: Conjunctivae normal.     Pupils: Pupils are equal, round, and reactive to light.  Cardiovascular:     Rate and Rhythm: Normal rate and regular rhythm.     Heart sounds: No murmur heard. Pulmonary:     Effort: Pulmonary effort is normal. No respiratory distress.     Breath sounds: Normal breath sounds. No wheezing, rhonchi or rales.  Abdominal:     General: Abdomen is flat. Bowel sounds are normal.  Musculoskeletal:     Cervical back: Normal range of motion and neck supple.  Skin:    General: Skin is warm and dry.  Capillary Refill: Capillary refill takes less than 2 seconds.  Neurological:     General: No focal deficit present.     Mental Status: He is alert and oriented to person, place, and time.  Psychiatric:        Mood and Affect: Mood normal.        Behavior: Behavior normal.        Thought Content: Thought content normal.        Judgment: Judgment normal.    Results for orders placed or performed during the hospital encounter of 01/02/21  Resp Panel by RT-PCR (Flu A&B, Covid) Nasopharyngeal Swab   Specimen: Nasopharyngeal Swab; Nasopharyngeal(NP) swabs in vial transport medium  Result Value Ref Range   SARS Coronavirus 2 by RT PCR NEGATIVE NEGATIVE   Influenza A by PCR NEGATIVE NEGATIVE   Influenza B by PCR NEGATIVE NEGATIVE  Comprehensive metabolic panel  Result Value Ref Range   Sodium 137 135 - 145 mmol/L   Potassium 3.9 3.5 - 5.1 mmol/L   Chloride 103 98 - 111 mmol/L   CO2 24 22 - 32 mmol/L   Glucose, Bld 106 (H) 70 - 99 mg/dL   BUN 12 6 - 20 mg/dL    Creatinine, Ser 1.61 0.61 - 1.24 mg/dL   Calcium 9.5 8.9 - 09.6 mg/dL   Total Protein 7.9 6.5 - 8.1 g/dL   Albumin 4.6 3.5 - 5.0 g/dL   AST 27 15 - 41 U/L   ALT 48 (H) 0 - 44 U/L   Alkaline Phosphatase 72 38 - 126 U/L   Total Bilirubin 0.8 0.3 - 1.2 mg/dL   GFR, Estimated >04 >54 mL/min   Anion gap 10 5 - 15  Ethanol  Result Value Ref Range   Alcohol, Ethyl (B) <10 <10 mg/dL  Salicylate level  Result Value Ref Range   Salicylate Lvl <7.0 (L) 7.0 - 30.0 mg/dL  Acetaminophen level  Result Value Ref Range   Acetaminophen (Tylenol), Serum <10 (L) 10 - 30 ug/mL  cbc  Result Value Ref Range   WBC 9.5 4.0 - 10.5 K/uL   RBC 5.32 4.22 - 5.81 MIL/uL   Hemoglobin 16.3 13.0 - 17.0 g/dL   HCT 09.8 11.9 - 14.7 %   MCV 87.0 80.0 - 100.0 fL   MCH 30.6 26.0 - 34.0 pg   MCHC 35.2 30.0 - 36.0 g/dL   RDW 82.9 56.2 - 13.0 %   Platelets 259 150 - 400 K/uL   nRBC 0.0 0.0 - 0.2 %  Urine Drug Screen, Qualitative  Result Value Ref Range   Tricyclic, Ur Screen NONE DETECTED NONE DETECTED   Amphetamines, Ur Screen NONE DETECTED NONE DETECTED   MDMA (Ecstasy)Ur Screen NONE DETECTED NONE DETECTED   Cocaine Metabolite,Ur [place] NONE DETECTED NONE DETECTED   Opiate, Ur Screen NONE DETECTED NONE DETECTED   Phencyclidine (PCP) Ur S NONE DETECTED NONE DETECTED   Cannabinoid 50 Ng, Ur [place] NONE DETECTED NONE DETECTED   Barbiturates, Ur Screen NONE DETECTED NONE DETECTED   Benzodiazepine, Ur Scrn NONE DETECTED NONE DETECTED   Methadone Scn, Ur NONE DETECTED NONE DETECTED      Assessment & Plan:   Problem List Items Addressed This Visit   None    Follow up plan: No follow-ups on file.

## 2021-10-06 ENCOUNTER — Encounter: Payer: Self-pay | Admitting: Nurse Practitioner

## 2021-10-06 ENCOUNTER — Ambulatory Visit (INDEPENDENT_AMBULATORY_CARE_PROVIDER_SITE_OTHER): Payer: BC Managed Care – PPO | Admitting: Nurse Practitioner

## 2021-10-06 VITALS — BP 142/92 | HR 69 | Temp 97.6°F | Ht 74.02 in | Wt 382.0 lb

## 2021-10-06 DIAGNOSIS — F419 Anxiety disorder, unspecified: Secondary | ICD-10-CM

## 2021-10-06 DIAGNOSIS — F332 Major depressive disorder, recurrent severe without psychotic features: Secondary | ICD-10-CM

## 2021-10-06 DIAGNOSIS — I10 Essential (primary) hypertension: Secondary | ICD-10-CM | POA: Diagnosis not present

## 2021-10-06 MED ORDER — VALSARTAN 80 MG PO TABS: 80.0000 mg | ORAL_TABLET | Freq: Every day | ORAL | 0 refills | Status: DC

## 2021-10-06 MED ORDER — BUSPIRONE HCL 10 MG PO TABS: 10.0000 mg | ORAL_TABLET | Freq: Two times a day (BID) | ORAL | 0 refills | Status: DC

## 2021-10-06 NOTE — Assessment & Plan Note (Addendum)
Chronic.  Improved.  Continue with current medication regimen of Abilify 5mg  and Prozaz 40mg .  If still experiencing OCD symptoms at next visit will go to Prozac 60mg  daily.  Return to clinic in 1 months for reevaluation.  Call sooner if concerns arise.

## 2021-10-06 NOTE — Assessment & Plan Note (Signed)
Chronic.  Not well controlled.  Will increase the Buspar to 10mg  BID. Discussed taking medication BID due to it being shorter acting.  Return to clinic in 1 months for reevaluation.  Call sooner if concerns arise.

## 2021-10-06 NOTE — Assessment & Plan Note (Signed)
New diagnosis.  Has had several elevated blood pressures over the last several months. Will start Valsartan 80mg  daily.  Side effects and benefits of medication discussed during visit.  Labs ordered today.  Follow up in 1 month for reevaluation.  Call sooner if concerns arise.

## 2021-10-06 NOTE — Progress Notes (Signed)
BP (!) 142/92   Pulse 69   Temp 97.6 F (36.4 C) (Oral)   Ht 6' 2.02" (1.88 m)   Wt (!) 382 lb (173.3 kg)   SpO2 97%   BMI 49.02 kg/m    Subjective:    Patient ID: William Reid, male    DOB: December 15, 1990, 31 y.o.   MRN: [id number]  HPI: William Reid is a 31 y.o. male  Chief Complaint  Patient presents with   Depression/Anxiety    Still having a little issues, would like to increase medication dosage   DEPRESSION/ANXIETY Patient states he still likes the abilify.  He is still having some anxiety.  He was wondering if his Buspar can be increased.  States he feels like he also might benefit from increasing the prozac.  Flowsheet Row Office Visit from 10/06/2021 in [facility]  PHQ-9 Total Score 5         10/06/2021    9:58 AM 09/03/2021    3:38 PM 08/15/2021    4:38 PM 08/08/2021    3:53 PM  GAD 7 : Generalized Anxiety Score  Nervous, Anxious, on Edge 2 1 1 3   Control/stop worrying 1 0 1 3  Worry too much - different things 1 0 1 2  Trouble relaxing 2 0 1 3  Restless 2 0 1 3  Easily annoyed or irritable 1 1 1 3   Afraid - awful might happen 1 0 1 3  Total GAD 7 Score 10 2 7 20   Anxiety Difficulty Somewhat difficult Somewhat difficult Somewhat difficult Somewhat difficult   HYPERTENSION Hypertension status: uncontrolled  Satisfied with current treatment? no Duration of hypertension: years BP monitoring frequency:  not checking BP range:  BP medication side effects:  no Medication compliance: excellent compliance Previous BP meds:none Aspirin: no Recurrent headaches: no Visual changes: no Palpitations: no Dyspnea: no Chest pain: no Lower extremity edema: no Dizzy/lightheaded: no    Relevant past medical, surgical, family and social history reviewed and updated as indicated. Interim medical history since our last visit reviewed. Allergies and medications reviewed and updated.  Review of Systems  Eyes:  Negative for visual disturbance.   Respiratory:  Negative for shortness of breath.   Cardiovascular:  Negative for chest pain and leg swelling.  Neurological:  Negative for light-headedness and headaches.  Psychiatric/Behavioral:  Positive for dysphoric mood. Negative for suicidal ideas. The patient is nervous/anxious.     Per HPI unless specifically indicated above     Objective:    BP (!) 142/92   Pulse 69   Temp 97.6 F (36.4 C) (Oral)   Ht 6' 2.02" (1.88 m)   Wt (!) 382 lb (173.3 kg)   SpO2 97%   BMI 49.02 kg/m   Wt Readings from Last 3 Encounters:  10/06/21 (!) 382 lb (173.3 kg)  09/03/21 (!) 370 lb (167.8 kg)  08/15/21 (!) 356 lb 9.6 oz (161.8 kg)    Physical Exam Vitals and nursing note reviewed.  Constitutional:      General: He is not in acute distress.    Appearance: Normal appearance. He is obese. He is not ill-appearing, toxic-appearing or diaphoretic.  HENT:     Head: Normocephalic.     Right Ear: External ear normal.     Left Ear: External ear normal.     Nose: Nose normal. No congestion or rhinorrhea.     Mouth/Throat:     Mouth: Mucous membranes are moist.  Eyes:  General:        Right eye: No discharge.        Left eye: No discharge.     Extraocular Movements: Extraocular movements intact.     Conjunctiva/sclera: Conjunctivae normal.     Pupils: Pupils are equal, round, and reactive to light.  Cardiovascular:     Rate and Rhythm: Normal rate and regular rhythm.     Heart sounds: No murmur heard. Pulmonary:     Effort: Pulmonary effort is normal. No respiratory distress.     Breath sounds: Normal breath sounds. No wheezing, rhonchi or rales.  Abdominal:     General: Abdomen is flat. Bowel sounds are normal.  Musculoskeletal:     Cervical back: Normal range of motion and neck supple.  Skin:    General: Skin is warm and dry.     Capillary Refill: Capillary refill takes less than 2 seconds.  Neurological:     General: No focal deficit present.     Mental Status: He is alert  and oriented to person, place, and time.  Psychiatric:        Mood and Affect: Mood normal.        Behavior: Behavior normal.        Thought Content: Thought content normal.        Judgment: Judgment normal.     Results for orders placed or performed during the hospital encounter of 01/02/21  Resp Panel by RT-PCR (Flu A&B, Covid) Nasopharyngeal Swab   Specimen: Nasopharyngeal Swab; Nasopharyngeal(NP) swabs in vial transport medium  Result Value Ref Range   SARS Coronavirus 2 by RT PCR NEGATIVE NEGATIVE   Influenza A by PCR NEGATIVE NEGATIVE   Influenza B by PCR NEGATIVE NEGATIVE  Comprehensive metabolic panel  Result Value Ref Range   Sodium 137 135 - 145 mmol/L   Potassium 3.9 3.5 - 5.1 mmol/L   Chloride 103 98 - 111 mmol/L   CO2 24 22 - 32 mmol/L   Glucose, Bld 106 (H) 70 - 99 mg/dL   BUN 12 6 - 20 mg/dL   Creatinine, Ser 6.07 0.61 - 1.24 mg/dL   Calcium 9.5 8.9 - 37.1 mg/dL   Total Protein 7.9 6.5 - 8.1 g/dL   Albumin 4.6 3.5 - 5.0 g/dL   AST 27 15 - 41 U/L   ALT 48 (H) 0 - 44 U/L   Alkaline Phosphatase 72 38 - 126 U/L   Total Bilirubin 0.8 0.3 - 1.2 mg/dL   GFR, Estimated >06 >26 mL/min   Anion gap 10 5 - 15  Ethanol  Result Value Ref Range   Alcohol, Ethyl (B) <10 <10 mg/dL  Salicylate level  Result Value Ref Range   Salicylate Lvl <7.0 (L) 7.0 - 30.0 mg/dL  Acetaminophen level  Result Value Ref Range   Acetaminophen (Tylenol), Serum <10 (L) 10 - 30 ug/mL  cbc  Result Value Ref Range   WBC 9.5 4.0 - 10.5 K/uL   RBC 5.32 4.22 - 5.81 MIL/uL   Hemoglobin 16.3 13.0 - 17.0 g/dL   HCT 94.8 54.6 - 27.0 %   MCV 87.0 80.0 - 100.0 fL   MCH 30.6 26.0 - 34.0 pg   MCHC 35.2 30.0 - 36.0 g/dL   RDW 35.0 09.3 - 81.8 %   Platelets 259 150 - 400 K/uL   nRBC 0.0 0.0 - 0.2 %  Urine Drug Screen, Qualitative  Result Value Ref Range   Tricyclic, Ur Screen NONE DETECTED NONE DETECTED  Amphetamines, Ur Screen NONE DETECTED NONE DETECTED   MDMA (Ecstasy)Ur Screen NONE DETECTED  NONE DETECTED   Cocaine Metabolite,Ur [place] NONE DETECTED NONE DETECTED   Opiate, Ur Screen NONE DETECTED NONE DETECTED   Phencyclidine (PCP) Ur S NONE DETECTED NONE DETECTED   Cannabinoid 50 Ng, Ur [place] NONE DETECTED NONE DETECTED   Barbiturates, Ur Screen NONE DETECTED NONE DETECTED   Benzodiazepine, Ur Scrn NONE DETECTED NONE DETECTED   Methadone Scn, Ur NONE DETECTED NONE DETECTED      Assessment & Plan:   Problem List Items Addressed This Visit       Cardiovascular and Mediastinum   Primary hypertension    New diagnosis.  Has had several elevated blood pressures over the last several months. Will start Valsartan 80mg  daily.  Side effects and benefits of medication discussed during visit.  Labs ordered today.  Follow up in 1 month for reevaluation.  Call sooner if concerns arise.       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  Improved.  Continue with current medication regimen of Abilify 5mg  and Prozaz 40mg .  If still experiencing OCD symptoms at next visit will go to Prozac 60mg  daily.  Return to clinic in 1 months for reevaluation.  Call sooner if concerns arise.        Relevant Medications   busPIRone (BUSPAR) 10 MG tablet   Anxiety    Chronic.  Not well controlled.  Will increase the Buspar to 10mg  BID. Discussed taking medication BID due to it being shorter acting.  Return to clinic in 1 months for reevaluation.  Call sooner if concerns arise.        Relevant Medications   busPIRone (BUSPAR) 10 MG tablet     Follow up plan: Return in about 1 month (around 11/06/2021) for BP Check.

## 2021-11-03 ENCOUNTER — Other Ambulatory Visit: Payer: Self-pay | Admitting: Nurse Practitioner

## 2021-11-04 NOTE — Telephone Encounter (Signed)
Requested Prescriptions  Pending Prescriptions Disp Refills  . valsartan (DIOVAN) 80 MG tablet [Pharmacy Med Name: VALSARTAN 80 MG TABLET] 30 tablet 0    Sig: TAKE 1 TABLET BY MOUTH EVERY DAY     Cardiovascular:  Angiotensin Receptor Blockers Failed - 11/03/2021  2:11 AM      Failed - Cr in normal range and within 180 days    Creatinine, Ser  Date Value Ref Range Status  01/02/2021 0.94 0.61 - 1.24 mg/dL Final         Failed - K in normal range and within 180 days    Potassium  Date Value Ref Range Status  01/02/2021 3.9 3.5 - 5.1 mmol/L Final         Failed - Last BP in normal range    BP Readings from Last 1 Encounters:  10/06/21 (!) 142/92         Passed - Patient is not pregnant      Passed - Valid encounter within last 6 months    Recent Outpatient Visits          4 weeks ago MDD (major depressive disorder), recurrent severe, without psychosis (HCC)   [facility] [place], Clydie Braun, NP   2 months ago Severe major depression, single episode, with psychotic features (HCC)   [facility] Larae Grooms, NP   2 months ago Severe major depression, single episode, with psychotic features (HCC)   [facility] Larae Grooms, NP   2 months ago MDD (major depressive disorder), recurrent severe, without psychosis (HCC)   [facility] Larae Grooms, NP   4 months ago Anxiety   [facility] Vigg, Chief Operating Officer, MD      Future Appointments            In 2 days Larae Grooms, NP [facility], [facility]           . busPIRone (BUSPAR) 10 MG tablet [Pharmacy Med Name: BUSPIRONE HCL 10 MG TABLET] 60 tablet 0    Sig: TAKE 1 TABLET BY MOUTH TWICE A DAY     Psychiatry: Anxiolytics/Hypnotics - Non-controlled Passed - 11/03/2021  2:11 AM      Passed - Valid encounter within last 12 months    Recent Outpatient Visits          4 weeks ago MDD (major depressive disorder), recurrent severe, without psychosis (HCC)    [facility] [place], Clydie Braun, NP   2 months ago Severe major depression, single episode, with psychotic features (HCC)   [facility] Larae Grooms, NP   2 months ago Severe major depression, single episode, with psychotic features (HCC)   [facility] Larae Grooms, NP   2 months ago MDD (major depressive disorder), recurrent severe, without psychosis (HCC)   [facility] Larae Grooms, NP   4 months ago Anxiety   [facility] Vigg, Avanti, MD      Future Appointments            In 2 days Larae Grooms, NP [facility], [facility]

## 2021-11-06 ENCOUNTER — Ambulatory Visit: Payer: BC Managed Care – PPO | Admitting: Nurse Practitioner

## 2021-11-06 NOTE — Progress Notes (Deleted)
There were no vitals taken for this visit.   Subjective:    Patient ID: William Reid, male    DOB: [birth year], 31 y.o.   MRN: [id number]  HPI: William Reid is a 31 y.o. male  No chief complaint on file.  DEPRESSION/ANXIETY Patient states he still likes the abilify.  He is still having some anxiety.  He was wondering if his Buspar can be increased.  States he feels like he also might benefit from increasing the prozac.  Flowsheet Row Office Visit from 10/06/2021 in [facility]  PHQ-9 Total Score 5        10/06/2021    9:58 AM 09/03/2021    3:38 PM 08/15/2021    4:38 PM 08/08/2021    3:53 PM  GAD 7 : Generalized Anxiety Score  Nervous, Anxious, on Edge 2 1 1 3   Control/stop worrying 1 0 1 3  Worry too much - different things 1 0 1 2  Trouble relaxing 2 0 1 3  Restless 2 0 1 3  Easily annoyed or irritable 1 1 1 3   Afraid - awful might happen 1 0 1 3  Total GAD 7 Score 10 2 7 20   Anxiety Difficulty Somewhat difficult Somewhat difficult Somewhat difficult Somewhat difficult   HYPERTENSION Hypertension status: uncontrolled  Satisfied with current treatment? no Duration of hypertension: years BP monitoring frequency:  not checking BP range:  BP medication side effects:  no Medication compliance: excellent compliance Previous BP meds:none Aspirin: no Recurrent headaches: no Visual changes: no Palpitations: no Dyspnea: no Chest pain: no Lower extremity edema: no Dizzy/lightheaded: no    Relevant past medical, surgical, family and social history reviewed and updated as indicated. Interim medical history since our last visit reviewed. Allergies and medications reviewed and updated.  Review of Systems  Eyes:  Negative for visual disturbance.  Respiratory:  Negative for shortness of breath.   Cardiovascular:  Negative for chest pain and leg swelling.  Neurological:  Negative for light-headedness and headaches.  Psychiatric/Behavioral:  Positive for  dysphoric mood. Negative for suicidal ideas. The patient is nervous/anxious.     Per HPI unless specifically indicated above     Objective:    There were no vitals taken for this visit.  Wt Readings from Last 3 Encounters:  10/06/21 (!) 382 lb (173.3 kg)  09/03/21 (!) 370 lb (167.8 kg)  08/15/21 (!) 356 lb 9.6 oz (161.8 kg)    Physical Exam Vitals and nursing note reviewed.  Constitutional:      General: He is not in acute distress.    Appearance: Normal appearance. He is obese. He is not ill-appearing, toxic-appearing or diaphoretic.  HENT:     Head: Normocephalic.     Right Ear: External ear normal.     Left Ear: External ear normal.     Nose: Nose normal. No congestion or rhinorrhea.     Mouth/Throat:     Mouth: Mucous membranes are moist.  Eyes:     General:        Right eye: No discharge.        Left eye: No discharge.     Extraocular Movements: Extraocular movements intact.     Conjunctiva/sclera: Conjunctivae normal.     Pupils: Pupils are equal, round, and reactive to light.  Cardiovascular:     Rate and Rhythm: Normal rate and regular rhythm.     Heart sounds: No murmur heard. Pulmonary:     Effort: Pulmonary effort is  normal. No respiratory distress.     Breath sounds: Normal breath sounds. No wheezing, rhonchi or rales.  Abdominal:     General: Abdomen is flat. Bowel sounds are normal.  Musculoskeletal:     Cervical back: Normal range of motion and neck supple.  Skin:    General: Skin is warm and dry.     Capillary Refill: Capillary refill takes less than 2 seconds.  Neurological:     General: No focal deficit present.     Mental Status: He is alert and oriented to person, place, and time.  Psychiatric:        Mood and Affect: Mood normal.        Behavior: Behavior normal.        Thought Content: Thought content normal.        Judgment: Judgment normal.    Results for orders placed or performed during the hospital encounter of 01/02/21  Resp Panel  by RT-PCR (Flu A&B, Covid) Nasopharyngeal Swab   Specimen: Nasopharyngeal Swab; Nasopharyngeal(NP) swabs in vial transport medium  Result Value Ref Range   SARS Coronavirus 2 by RT PCR NEGATIVE NEGATIVE   Influenza A by PCR NEGATIVE NEGATIVE   Influenza B by PCR NEGATIVE NEGATIVE  Comprehensive metabolic panel  Result Value Ref Range   Sodium 137 135 - 145 mmol/L   Potassium 3.9 3.5 - 5.1 mmol/L   Chloride 103 98 - 111 mmol/L   CO2 24 22 - 32 mmol/L   Glucose, Bld 106 (H) 70 - 99 mg/dL   BUN 12 6 - 20 mg/dL   Creatinine, Ser 0.94 0.61 - 1.24 mg/dL   Calcium 9.5 8.9 - 10.3 mg/dL   Total Protein 7.9 6.5 - 8.1 g/dL   Albumin 4.6 3.5 - 5.0 g/dL   AST 27 15 - 41 U/L   ALT 48 (H) 0 - 44 U/L   Alkaline Phosphatase 72 38 - 126 U/L   Total Bilirubin 0.8 0.3 - 1.2 mg/dL   GFR, Estimated >60 >60 mL/min   Anion gap 10 5 - 15  Ethanol  Result Value Ref Range   Alcohol, Ethyl (B) Q000111Q Q000111Q mg/dL  Salicylate level  Result Value Ref Range   Salicylate Lvl Q000111Q (L) 7.0 - 30.0 mg/dL  Acetaminophen level  Result Value Ref Range   Acetaminophen (Tylenol), Serum <10 (L) 10 - 30 ug/mL  cbc  Result Value Ref Range   WBC 9.5 4.0 - 10.5 K/uL   RBC 5.32 4.22 - 5.81 MIL/uL   Hemoglobin 16.3 13.0 - 17.0 g/dL   HCT 46.3 39.0 - 52.0 %   MCV 87.0 80.0 - 100.0 fL   MCH 30.6 26.0 - 34.0 pg   MCHC 35.2 30.0 - 36.0 g/dL   RDW 11.9 11.5 - 15.5 %   Platelets 259 150 - 400 K/uL   nRBC 0.0 0.0 - 0.2 %  Urine Drug Screen, Qualitative  Result Value Ref Range   Tricyclic, Ur Screen NONE DETECTED NONE DETECTED   Amphetamines, Ur Screen NONE DETECTED NONE DETECTED   MDMA (Ecstasy)Ur Screen NONE DETECTED NONE DETECTED   Cocaine Metabolite,Ur [place] NONE DETECTED NONE DETECTED   Opiate, Ur Screen NONE DETECTED NONE DETECTED   Phencyclidine (PCP) Ur S NONE DETECTED NONE DETECTED   Cannabinoid 50 Ng, Ur [place] NONE DETECTED NONE DETECTED   Barbiturates, Ur Screen NONE DETECTED NONE DETECTED   Benzodiazepine, Ur Scrn  NONE DETECTED NONE DETECTED   Methadone Scn, Ur NONE DETECTED NONE DETECTED  Assessment & Plan:   Problem List Items Addressed This Visit      Cardiovascular and Mediastinum   Primary hypertension - Primary     Other   Severe major depression, single episode, with psychotic features (HCC)   Anxiety     Follow up plan: No follow-ups on file.

## 2021-11-12 ENCOUNTER — Ambulatory Visit: Payer: BC Managed Care – PPO | Admitting: Psychiatry

## 2021-11-19 ENCOUNTER — Ambulatory Visit: Payer: Self-pay | Admitting: *Deleted

## 2021-11-19 NOTE — Telephone Encounter (Signed)
  Chief Complaint: dizziness Symptoms: dizziness,headache Frequency: couple days Pertinent Negatives: Patient denies fever, chest pain, vomiting, diarrhea, bleeding Disposition: [] ED /[] Urgent Care (no appt availability in office) / [x] Appointment(In office/virtual)/ []  [place] [facility]/ [] Home Care/ [] Refused Recommended Disposition /[] [place] [facility]/ []  Follow-up with PCP Additional Notes: Patient states he has not been the best at taking his BP medication. New onset dizziness, headache, unsteady on feet for seconds after exercise yesterday. Appointment scheduled- advised hydration, BP medication, be seen immediately if gets worse/changes

## 2021-11-19 NOTE — Telephone Encounter (Signed)
Summary: dizzyness and headache   Patient called states he has been feeling lightheaded and headache,happened last night.      Reason for Disposition  [1] MODERATE dizziness (e.g., interferes with normal activities) AND [2] has NOT been evaluated by doctor (or NP/PA) for this  (Exception: Dizziness caused by heat exposure, sudden standing, or poor fluid intake.)  Answer Assessment - Initial Assessment Questions 1. DESCRIPTION: "Describe your dizziness."     Turning body makes him dizzy 2. LIGHTHEADED: "Do you feel lightheaded?" (e.g., somewhat faint, woozy, weak upon standing)    Almost faint 3. VERTIGO: "Do you feel like either you or the room is spinning or tilting?" (i.e. vertigo)     Little bit 4. SEVERITY: "How bad is it?"  "Do you feel like you are going to faint?" "Can you stand and walk?"   - MILD: Feels slightly dizzy, but walking normally.   - MODERATE: Feels unsteady when walking, but not falling; interferes with normal activities (e.g., school, work).   - SEVERE: Unable to walk without falling, or requires assistance to walk without falling; feels like passing out now.      Mild/moderate 5. ONSET:  "When did the dizziness begin?"     Couple days 6. AGGRAVATING FACTORS: "Does anything make it worse?" (e.g., standing, change in head position)     Stress 7. HEART RATE: "Can you tell me your heart rate?" "How many beats in 15 seconds?"  (Note: not all patients can do this)       unsure 8. CAUSE: "What do you think is causing the dizziness?"     Unsure- could be BP 9. RECURRENT SYMPTOM: "Have you had dizziness before?" If Yes, ask: "When was the last time?" "What happened that time?"     Sometimes- couple days 10. OTHER SYMPTOMS: "Do you have any other symptoms?" (e.g., fever, chest pain, vomiting, diarrhea, bleeding)       no  Protocols used: Dizziness - Lightheadedness-A-AH

## 2021-11-20 ENCOUNTER — Ambulatory Visit: Payer: BC Managed Care – PPO | Admitting: Nurse Practitioner

## 2021-11-20 ENCOUNTER — Encounter: Payer: Self-pay | Admitting: Nurse Practitioner

## 2021-11-20 VITALS — BP 132/80 | HR 68 | Temp 97.9°F | Wt 395.8 lb

## 2021-11-20 DIAGNOSIS — H6593 Unspecified nonsuppurative otitis media, bilateral: Secondary | ICD-10-CM

## 2021-11-20 MED ORDER — METHYLPREDNISOLONE 4 MG PO TBPK: ORAL_TABLET | ORAL | 0 refills | Status: DC

## 2021-11-20 NOTE — Progress Notes (Signed)
BP 132/80   Pulse 68   Temp 97.9 F (36.6 C) (Oral)   Wt (!) 395 lb 12.8 oz (179.5 kg)   SpO2 98%   BMI 50.80 kg/m    Subjective:    Patient ID: William Reid, male    DOB: 04-21-1990, 31 y.o.   MRN: [id number]  HPI: William Reid is a 31 y.o. male  Chief Complaint  Patient presents with   Dizziness    X a few days per patient. Denies n/v/d. Reports HA's, some visual disturbance.    Medication Management    Pt reports he is not taking  abilify, buspar, prozac. States he was feeling like he was before, so he has stopped taking the meds.    DIZZINESS Patient states he stopped the medications about two weeks ago. He feels like he was having some of the same thoughts as before and didn't feel like the medications weren't helping. Duration:  a few days Description of symptoms: room spinning Duration of episode:  1 second Dizziness frequency:  a couple of times during the day Provoking factors:  sudden movements Aggravating factors:   sudden movements Triggered by rolling over in bed: no Triggered by bending over: yes Aggravated by head movement: yes Aggravated by exertion, coughing, loud noises: no Recent head injury: no Recent or current viral symptoms: no History of vasovagal episodes: no Nausea: no Vomiting: no Tinnitus: no Hearing loss: no Aural fullness: no Headache: yes Photophobia/phonophobia: no Unsteady gait: no Postural instability: no Diplopia, dysarthria, dysphagia or weakness: no Related to exertion: no Pallor: no Diaphoresis: no Dyspnea: yes Chest pain: no  Relevant past medical, surgical, family and social history reviewed and updated as indicated. Interim medical history since our last visit reviewed. Allergies and medications reviewed and updated.  Review of Systems  HENT:  Negative for congestion.   Gastrointestinal:  Negative for nausea and vomiting.  Neurological:  Positive for dizziness and headaches.    Per HPI unless specifically  indicated above     Objective:    BP 132/80   Pulse 68   Temp 97.9 F (36.6 C) (Oral)   Wt (!) 395 lb 12.8 oz (179.5 kg)   SpO2 98%   BMI 50.80 kg/m   Wt Readings from Last 3 Encounters:  11/20/21 (!) 395 lb 12.8 oz (179.5 kg)  10/06/21 (!) 382 lb (173.3 kg)  09/03/21 (!) 370 lb (167.8 kg)    Physical Exam Vitals and nursing note reviewed.  Constitutional:      General: He is not in acute distress.    Appearance: Normal appearance. He is not ill-appearing, toxic-appearing or diaphoretic.  HENT:     Head: Normocephalic.     Right Ear: External ear normal. A middle ear effusion is present. Tympanic membrane is not erythematous.     Left Ear: External ear normal. A middle ear effusion is present. Tympanic membrane is not erythematous.     Nose: Nose normal. No congestion or rhinorrhea.     Mouth/Throat:     Mouth: Mucous membranes are moist.  Eyes:     General:        Right eye: No discharge.        Left eye: No discharge.     Extraocular Movements: Extraocular movements intact.     Conjunctiva/sclera: Conjunctivae normal.     Pupils: Pupils are equal, round, and reactive to light.  Cardiovascular:     Rate and Rhythm: Normal rate and regular rhythm.  Heart sounds: No murmur heard. Pulmonary:     Effort: Pulmonary effort is normal. No respiratory distress.     Breath sounds: Normal breath sounds. No wheezing, rhonchi or rales.  Abdominal:     General: Abdomen is flat. Bowel sounds are normal.  Musculoskeletal:     Cervical back: Normal range of motion and neck supple.  Skin:    General: Skin is warm and dry.     Capillary Refill: Capillary refill takes less than 2 seconds.  Neurological:     General: No focal deficit present.     Mental Status: He is alert and oriented to person, place, and time.  Psychiatric:        Mood and Affect: Mood normal.        Behavior: Behavior normal.        Thought Content: Thought content normal.        Judgment: Judgment normal.      Results for orders placed or performed during the hospital encounter of 01/02/21  Resp Panel by RT-PCR (Flu A&B, Covid) Nasopharyngeal Swab   Specimen: Nasopharyngeal Swab; Nasopharyngeal(NP) swabs in vial transport medium  Result Value Ref Range   SARS Coronavirus 2 by RT PCR NEGATIVE NEGATIVE   Influenza A by PCR NEGATIVE NEGATIVE   Influenza B by PCR NEGATIVE NEGATIVE  Comprehensive metabolic panel  Result Value Ref Range   Sodium 137 135 - 145 mmol/L   Potassium 3.9 3.5 - 5.1 mmol/L   Chloride 103 98 - 111 mmol/L   CO2 24 22 - 32 mmol/L   Glucose, Bld 106 (H) 70 - 99 mg/dL   BUN 12 6 - 20 mg/dL   Creatinine, Ser 4.27 0.61 - 1.24 mg/dL   Calcium 9.5 8.9 - 06.2 mg/dL   Total Protein 7.9 6.5 - 8.1 g/dL   Albumin 4.6 3.5 - 5.0 g/dL   AST 27 15 - 41 U/L   ALT 48 (H) 0 - 44 U/L   Alkaline Phosphatase 72 38 - 126 U/L   Total Bilirubin 0.8 0.3 - 1.2 mg/dL   GFR, Estimated >37 >62 mL/min   Anion gap 10 5 - 15  Ethanol  Result Value Ref Range   Alcohol, Ethyl (B) <10 <10 mg/dL  Salicylate level  Result Value Ref Range   Salicylate Lvl <7.0 (L) 7.0 - 30.0 mg/dL  Acetaminophen level  Result Value Ref Range   Acetaminophen (Tylenol), Serum <10 (L) 10 - 30 ug/mL  cbc  Result Value Ref Range   WBC 9.5 4.0 - 10.5 K/uL   RBC 5.32 4.22 - 5.81 MIL/uL   Hemoglobin 16.3 13.0 - 17.0 g/dL   HCT 83.1 51.7 - 61.6 %   MCV 87.0 80.0 - 100.0 fL   MCH 30.6 26.0 - 34.0 pg   MCHC 35.2 30.0 - 36.0 g/dL   RDW 07.3 71.0 - 62.6 %   Platelets 259 150 - 400 K/uL   nRBC 0.0 0.0 - 0.2 %  Urine Drug Screen, Qualitative  Result Value Ref Range   Tricyclic, Ur Screen NONE DETECTED NONE DETECTED   Amphetamines, Ur Screen NONE DETECTED NONE DETECTED   MDMA (Ecstasy)Ur Screen NONE DETECTED NONE DETECTED   Cocaine Metabolite,Ur [place] NONE DETECTED NONE DETECTED   Opiate, Ur Screen NONE DETECTED NONE DETECTED   Phencyclidine (PCP) Ur S NONE DETECTED NONE DETECTED   Cannabinoid 50 Ng, Ur [place] NONE  DETECTED NONE DETECTED   Barbiturates, Ur Screen NONE DETECTED NONE DETECTED   Benzodiazepine, Ur Scrn  NONE DETECTED NONE DETECTED   Methadone Scn, Ur NONE DETECTED NONE DETECTED      Assessment & Plan:   Problem List Items Addressed This Visit   None Visit Diagnoses     Fluid level behind tympanic membrane of both ears    -  Primary   Will treat with Medrol dose pak. Complete course of medication. Return to clinic if symptoms do not improve.        Follow up plan: Return if symptoms worsen or fail to improve.

## 2022-01-02 ENCOUNTER — Other Ambulatory Visit: Payer: Self-pay | Admitting: Nurse Practitioner

## 2022-01-02 NOTE — Telephone Encounter (Signed)
Dose no longer current  Requested Prescriptions  Pending Prescriptions Disp Refills  . busPIRone (BUSPAR) 5 MG tablet [Pharmacy Med Name: BUSPIRONE HCL 5 MG TABLET] 180 tablet 0    Sig: TAKE 1 TABLET BY MOUTH TWICE A DAY     Psychiatry: Anxiolytics/Hypnotics - Non-controlled Passed - 01/02/2022  2:14 AM      Passed - Valid encounter within last 12 months    Recent Outpatient Visits          1 month ago Fluid level behind tympanic membrane of both ears   [place], NP   2 months ago MDD (major depressive disorder), recurrent severe, without psychosis ([place])   [facility] Jon Billings, NP   4 months ago Severe major depression, single episode, with psychotic features ([place])   [facility] Jon Billings, NP   4 months ago Severe major depression, single episode, with psychotic features ([place])   [facility] Jon Billings, NP   4 months ago MDD (major depressive disorder), recurrent severe, without psychosis ([place])   [facility] Jon Billings, NP

## 2022-01-20 ENCOUNTER — Other Ambulatory Visit: Payer: Self-pay | Admitting: Nurse Practitioner

## 2022-01-21 NOTE — Telephone Encounter (Signed)
Request for valacyclovir ( valtrex) 1000 mg from [facility]. Medication no longer on med list. Completed course 03/19/21. Called pharmacy to have patient contact office to review sx.

## 2022-01-21 NOTE — Telephone Encounter (Signed)
Requested by interface surescripts. Medication not on med list. Course completed 03/19/21.  Requested Prescriptions  Refused Prescriptions Disp Refills  . valACYclovir (VALTREX) 1000 MG tablet [Pharmacy Med Name: VALACYCLOVIR HCL 1 GRAM TABLET] 20 tablet     Sig: TAKE 1 TABLET (1,000 MG TOTAL) BY MOUTH 2 (TWO) TIMES DAILY FOR 10 DAYS.     Antimicrobials:  Antiviral Agents - Anti-Herpetic Passed - 01/20/2022 11:56 AM      Passed - Valid encounter within last 12 months    Recent Outpatient Visits          2 months ago Fluid level behind tympanic membrane of both ears   [facility] [place], Santiago Glad, NP   3 months ago MDD (major depressive disorder), recurrent severe, without psychosis ([place])   [facility] Jon Billings, NP   4 months ago Severe major depression, single episode, with psychotic features ([place])   [facility] Jon Billings, NP   5 months ago Severe major depression, single episode, with psychotic features ([place])   [facility] Jon Billings, NP   5 months ago MDD (major depressive disorder), recurrent severe, without psychosis ([place])   [facility] Jon Billings, NP

## 2022-02-16 ENCOUNTER — Other Ambulatory Visit: Payer: Self-pay | Admitting: Nurse Practitioner

## 2022-02-16 NOTE — Telephone Encounter (Signed)
Requested medication (s) are due for refill today - no  Requested medication (s) are on the active medication list -yes  Future visit scheduled -no  Last refill: 09/03/21 #90 1 RF  Notes to clinic: non delegated Rx  Requested Prescriptions  Pending Prescriptions Disp Refills   ARIPiprazole (ABILIFY) 5 MG tablet [Pharmacy Med Name: ARIPIPRAZOLE 5 MG TABLET] 90 tablet 1    Sig: Take 1 tablet (5 mg total) by mouth daily.     Not Delegated - Psychiatry:  Antipsychotics - Second Generation (Atypical) - aripiprazole Failed - 02/16/2022  1:52 AM      Failed - This refill cannot be delegated      Failed - TSH in normal range and within 360 days    TSH  Date Value Ref Range Status  08/31/2019 0.912 0.450 - 4.500 uIU/mL Final         Failed - Lipid Panel in normal range within the last 12 months    Cholesterol, Total  Date Value Ref Range Status  08/31/2019 186 100 - 199 mg/dL Final   LDL Chol Calc (NIH)  Date Value Ref Range Status  08/31/2019 123 (H) 0 - 99 mg/dL Final   HDL  Date Value Ref Range Status  08/31/2019 32 (L) >39 mg/dL Final   Triglycerides  Date Value Ref Range Status  08/31/2019 175 (H) 0 - 149 mg/dL Final         Failed - CBC within normal limits and completed in the last 12 months    WBC  Date Value Ref Range Status  01/02/2021 9.5 4.0 - 10.5 K/uL Final   RBC  Date Value Ref Range Status  01/02/2021 5.32 4.22 - 5.81 MIL/uL Final   Hemoglobin  Date Value Ref Range Status  01/02/2021 16.3 13.0 - 17.0 g/dL Final  08/31/2019 15.3 13.0 - 17.7 g/dL Final   HCT  Date Value Ref Range Status  01/02/2021 46.3 39.0 - 52.0 % Final   Hematocrit  Date Value Ref Range Status  08/31/2019 46.3 37.5 - 51.0 % Final   MCHC  Date Value Ref Range Status  01/02/2021 35.2 30.0 - 36.0 g/dL Final   [facility]  Date Value Ref Range Status  01/02/2021 30.6 26.0 - 34.0 pg Final   MCV  Date Value Ref Range Status  01/02/2021 87.0 80.0 - 100.0 fL Final  08/31/2019 88 79  - 97 fL Final   No results found for: "PLTCOUNTKUC", "LABPLAT", "POCPLA" RDW  Date Value Ref Range Status  01/02/2021 11.9 11.5 - 15.5 % Final  08/31/2019 12.3 11.6 - 15.4 % Final         Failed - CMP within normal limits and completed in the last 12 months    Albumin  Date Value Ref Range Status  01/02/2021 4.6 3.5 - 5.0 g/dL Final  08/31/2019 4.6 4.1 - 5.2 g/dL Final   Alkaline Phosphatase  Date Value Ref Range Status  01/02/2021 72 38 - 126 U/L Final   ALT  Date Value Ref Range Status  01/02/2021 48 (H) 0 - 44 U/L Final   AST  Date Value Ref Range Status  01/02/2021 27 15 - 41 U/L Final   BUN  Date Value Ref Range Status  01/02/2021 12 6 - 20 mg/dL Final  08/31/2019 11 6 - 20 mg/dL Final   Calcium  Date Value Ref Range Status  01/02/2021 9.5 8.9 - 10.3 mg/dL Final   CO2  Date Value Ref Range Status  01/02/2021 24  22 - 32 mmol/L Final   Creatinine, Ser  Date Value Ref Range Status  01/02/2021 0.94 0.61 - 1.24 mg/dL Final   Glucose, Bld  Date Value Ref Range Status  01/02/2021 106 (H) 70 - 99 mg/dL Final    Comment:    Glucose reference range applies only to samples taken after fasting for at least 8 hours.   Potassium  Date Value Ref Range Status  01/02/2021 3.9 3.5 - 5.1 mmol/L Final   Sodium  Date Value Ref Range Status  01/02/2021 137 135 - 145 mmol/L Final  08/31/2019 138 134 - 144 mmol/L Final   Total Bilirubin  Date Value Ref Range Status  01/02/2021 0.8 0.3 - 1.2 mg/dL Final   Bilirubin Total  Date Value Ref Range Status  08/31/2019 0.4 0.0 - 1.2 mg/dL Final   Protein,UA  Date Value Ref Range Status  08/31/2019 Negative Negative/Trace Final   Total Protein  Date Value Ref Range Status  01/02/2021 7.9 6.5 - 8.1 g/dL Final  08/31/2019 7.4 6.0 - 8.5 g/dL Final   GFR calc Af Amer  Date Value Ref Range Status  08/31/2019 135 >59 mL/min/1.73 Final    Comment:    **[facility] currently reports eGFR in compliance with the current**    recommendations of the [employer]. [facility] will   update reporting as new guidelines are published from the NKF-ASN   Task force.    GFR, Estimated  Date Value Ref Range Status  01/02/2021 >60 >60 mL/min Final    Comment:    (NOTE) Calculated using the CKD-EPI Creatinine Equation (2021)          Passed - Completed PHQ-2 or PHQ-9 in the last 360 days      Passed - Last BP in normal range    BP Readings from Last 1 Encounters:  11/20/21 132/80         Passed - Last Heart Rate in normal range    Pulse Readings from Last 1 Encounters:  11/20/21 68         Passed - Valid encounter within last 6 months    Recent Outpatient Visits           2 months ago Fluid level behind tympanic membrane of both ears   [facility], Santiago Glad, NP   4 months ago MDD (major depressive disorder), recurrent severe, without psychosis ([place])   [facility] Jon Billings, NP   5 months ago Severe major depression, single episode, with psychotic features ([place])   [facility] Jon Billings, NP   6 months ago Severe major depression, single episode, with psychotic features ([place])   [facility] Jon Billings, NP   6 months ago MDD (major depressive disorder), recurrent severe, without psychosis ([place])   [place], NP                 Requested Prescriptions  Pending Prescriptions Disp Refills   ARIPiprazole (ABILIFY) 5 MG tablet [Pharmacy Med Name: ARIPIPRAZOLE 5 MG TABLET] 90 tablet 1    Sig: Take 1 tablet (5 mg total) by mouth daily.     Not Delegated - Psychiatry:  Antipsychotics - Second Generation (Atypical) - aripiprazole Failed - 02/16/2022  1:52 AM      Failed - This refill cannot be delegated      Failed - TSH in normal range and within 360 days    TSH  Date Value Ref Range Status  08/31/2019  0.912 0.450 - 4.500 uIU/mL Final         Failed - Lipid Panel in normal  range within the last 12 months    Cholesterol, Total  Date Value Ref Range Status  08/31/2019 186 100 - 199 mg/dL Final   LDL Chol Calc (NIH)  Date Value Ref Range Status  08/31/2019 123 (H) 0 - 99 mg/dL Final   HDL  Date Value Ref Range Status  08/31/2019 32 (L) >39 mg/dL Final   Triglycerides  Date Value Ref Range Status  08/31/2019 175 (H) 0 - 149 mg/dL Final         Failed - CBC within normal limits and completed in the last 12 months    WBC  Date Value Ref Range Status  01/02/2021 9.5 4.0 - 10.5 K/uL Final   RBC  Date Value Ref Range Status  01/02/2021 5.32 4.22 - 5.81 MIL/uL Final   Hemoglobin  Date Value Ref Range Status  01/02/2021 16.3 13.0 - 17.0 g/dL Final  08/31/2019 15.3 13.0 - 17.7 g/dL Final   HCT  Date Value Ref Range Status  01/02/2021 46.3 39.0 - 52.0 % Final   Hematocrit  Date Value Ref Range Status  08/31/2019 46.3 37.5 - 51.0 % Final   MCHC  Date Value Ref Range Status  01/02/2021 35.2 30.0 - 36.0 g/dL Final   [facility]  Date Value Ref Range Status  01/02/2021 30.6 26.0 - 34.0 pg Final   MCV  Date Value Ref Range Status  01/02/2021 87.0 80.0 - 100.0 fL Final  08/31/2019 88 79 - 97 fL Final   No results found for: "PLTCOUNTKUC", "LABPLAT", "POCPLA" RDW  Date Value Ref Range Status  01/02/2021 11.9 11.5 - 15.5 % Final  08/31/2019 12.3 11.6 - 15.4 % Final         Failed - CMP within normal limits and completed in the last 12 months    Albumin  Date Value Ref Range Status  01/02/2021 4.6 3.5 - 5.0 g/dL Final  08/31/2019 4.6 4.1 - 5.2 g/dL Final   Alkaline Phosphatase  Date Value Ref Range Status  01/02/2021 72 38 - 126 U/L Final   ALT  Date Value Ref Range Status  01/02/2021 48 (H) 0 - 44 U/L Final   AST  Date Value Ref Range Status  01/02/2021 27 15 - 41 U/L Final   BUN  Date Value Ref Range Status  01/02/2021 12 6 - 20 mg/dL Final  08/31/2019 11 6 - 20 mg/dL Final   Calcium  Date Value Ref Range Status  01/02/2021  9.5 8.9 - 10.3 mg/dL Final   CO2  Date Value Ref Range Status  01/02/2021 24 22 - 32 mmol/L Final   Creatinine, Ser  Date Value Ref Range Status  01/02/2021 0.94 0.61 - 1.24 mg/dL Final   Glucose, Bld  Date Value Ref Range Status  01/02/2021 106 (H) 70 - 99 mg/dL Final    Comment:    Glucose reference range applies only to samples taken after fasting for at least 8 hours.   Potassium  Date Value Ref Range Status  01/02/2021 3.9 3.5 - 5.1 mmol/L Final   Sodium  Date Value Ref Range Status  01/02/2021 137 135 - 145 mmol/L Final  08/31/2019 138 134 - 144 mmol/L Final   Total Bilirubin  Date Value Ref Range Status  01/02/2021 0.8 0.3 - 1.2 mg/dL Final   Bilirubin Total  Date Value Ref Range Status  08/31/2019 0.4 0.0 -  1.2 mg/dL Final   Protein,UA  Date Value Ref Range Status  08/31/2019 Negative Negative/Trace Final   Total Protein  Date Value Ref Range Status  01/02/2021 7.9 6.5 - 8.1 g/dL Final  08/31/2019 7.4 6.0 - 8.5 g/dL Final   GFR calc Af Amer  Date Value Ref Range Status  08/31/2019 135 >59 mL/min/1.73 Final    Comment:    **[facility] currently reports eGFR in compliance with the current**   recommendations of the [employer]. [facility] will   update reporting as new guidelines are published from the NKF-ASN   Task force.    GFR, Estimated  Date Value Ref Range Status  01/02/2021 >60 >60 mL/min Final    Comment:    (NOTE) Calculated using the CKD-EPI Creatinine Equation (2021)          Passed - Completed PHQ-2 or PHQ-9 in the last 360 days      Passed - Last BP in normal range    BP Readings from Last 1 Encounters:  11/20/21 132/80         Passed - Last Heart Rate in normal range    Pulse Readings from Last 1 Encounters:  11/20/21 68         Passed - Valid encounter within last 6 months    Recent Outpatient Visits           2 months ago Fluid level behind tympanic membrane of both ears   [facility]  [employer] [facility], Santiago Glad, NP   4 months ago MDD (major depressive disorder), recurrent severe, without psychosis ([place])   [facility] Jon Billings, NP   5 months ago Severe major depression, single episode, with psychotic features ([place])   [facility] Jon Billings, NP   6 months ago Severe major depression, single episode, with psychotic features ([place])   [facility] Jon Billings, NP   6 months ago MDD (major depressive disorder), recurrent severe, without psychosis ([place])   [facility] Jon Billings, NP

## 2022-02-17 ENCOUNTER — Encounter: Payer: Self-pay | Admitting: Nurse Practitioner

## 2022-02-17 ENCOUNTER — Ambulatory Visit: Payer: BC Managed Care – PPO | Admitting: Nurse Practitioner

## 2022-02-17 VITALS — BP 156/103 | HR 74 | Temp 98.4°F | Wt >= 6400 oz

## 2022-02-17 DIAGNOSIS — R5383 Other fatigue: Secondary | ICD-10-CM | POA: Diagnosis not present

## 2022-02-17 DIAGNOSIS — Z23 Encounter for immunization: Secondary | ICD-10-CM | POA: Diagnosis not present

## 2022-02-17 DIAGNOSIS — R7989 Other specified abnormal findings of blood chemistry: Secondary | ICD-10-CM | POA: Diagnosis not present

## 2022-02-17 DIAGNOSIS — I10 Essential (primary) hypertension: Secondary | ICD-10-CM

## 2022-02-17 DIAGNOSIS — F332 Major depressive disorder, recurrent severe without psychotic features: Secondary | ICD-10-CM

## 2022-02-17 DIAGNOSIS — Z6841 Body Mass Index (BMI) 40.0 and over, adult: Secondary | ICD-10-CM

## 2022-02-17 NOTE — Progress Notes (Signed)
BP (!) 156/103   Pulse 74   Temp 98.4 F (36.9 C) (Oral)   Wt (!) 414 lb 9.6 oz (188.1 kg)   SpO2 96%   BMI 53.21 kg/m    Subjective:    Patient ID: William Reid, male    DOB: 10-04-1990, 31 y.o.   MRN: [id number]  HPI: [name] is a 31 y.o. male  Chief Complaint  Patient presents with   Obesity    Patient would like to discuss weight loss options    Medication Refill     Pt states he needs refill for valsartan    WEIGHT GAIN Duration: years Previous attempts at weight loss: no Complications of obesity: htn Peak weight: 414 lb Weight loss goal: 350lb Weight loss to date: 1 year Requesting obesity pharmacotherapy: yes Current weight loss supplements/medications: no Previous weight loss supplements/meds: no Calories:  Patient states he has a lot of fatigue and lacks the energy to go to the gym.    DEPRESSION/ANXIETY Patient states he restarted his medications shortly after our last visit. Feels like her is doing well.  HYPERTENSION without Chronic Kidney Disease Hypertension status: controlled  Satisfied with current treatment? yes Duration of hypertension: years BP monitoring frequency:  not checking BP range:  BP medication side effects:  no Medication compliance: excellent compliance Previous BP meds:valsartan Aspirin: no Recurrent headaches: no Visual changes: no Palpitations: no Dyspnea: no Chest pain: no Lower extremity edema: no Dizzy/lightheaded: no   Relevant past medical, surgical, family and social history reviewed and updated as indicated. Interim medical history since our last visit reviewed. Allergies and medications reviewed and updated.  Review of Systems  Constitutional:  Positive for fatigue and unexpected weight change.  Eyes:  Negative for visual disturbance.  Respiratory:  Negative for shortness of breath.   Cardiovascular:  Negative for chest pain and leg swelling.  Neurological:  Negative for light-headedness and  headaches.  Psychiatric/Behavioral:  Positive for dysphoric mood. Negative for suicidal ideas. The patient is nervous/anxious.     Per HPI unless specifically indicated above     Objective:    BP (!) 156/103   Pulse 74   Temp 98.4 F (36.9 C) (Oral)   Wt (!) 414 lb 9.6 oz (188.1 kg)   SpO2 96%   BMI 53.21 kg/m   Wt Readings from Last 3 Encounters:  02/17/22 (!) 414 lb 9.6 oz (188.1 kg)  11/20/21 (!) 395 lb 12.8 oz (179.5 kg)  10/06/21 (!) 382 lb (173.3 kg)    Physical Exam Vitals and nursing note reviewed.  Constitutional:      General: He is not in acute distress.    Appearance: Normal appearance. He is obese. He is not ill-appearing, toxic-appearing or diaphoretic.  HENT:     Head: Normocephalic.     Right Ear: External ear normal.     Left Ear: External ear normal.     Nose: Nose normal. No congestion or rhinorrhea.     Mouth/Throat:     Mouth: Mucous membranes are moist.  Eyes:     General:        Right eye: No discharge.        Left eye: No discharge.     Extraocular Movements: Extraocular movements intact.     Conjunctiva/sclera: Conjunctivae normal.     Pupils: Pupils are equal, round, and reactive to light.  Cardiovascular:     Rate and Rhythm: Normal rate and regular rhythm.     Heart sounds:  No murmur heard. Pulmonary:     Effort: Pulmonary effort is normal. No respiratory distress.     Breath sounds: Normal breath sounds. No wheezing, rhonchi or rales.  Abdominal:     General: Abdomen is flat. Bowel sounds are normal.  Musculoskeletal:     Cervical back: Normal range of motion and neck supple.  Skin:    General: Skin is warm and dry.     Capillary Refill: Capillary refill takes less than 2 seconds.  Neurological:     General: No focal deficit present.     Mental Status: He is alert and oriented to person, place, and time.  Psychiatric:        Mood and Affect: Mood normal.        Behavior: Behavior normal.        Thought Content: Thought content  normal.        Judgment: Judgment normal.     Results for orders placed or performed during the hospital encounter of 01/02/21  Resp Panel by RT-PCR (Flu A&B, Covid) Nasopharyngeal Swab   Specimen: Nasopharyngeal Swab; Nasopharyngeal(NP) swabs in vial transport medium  Result Value Ref Range   SARS Coronavirus 2 by RT PCR NEGATIVE NEGATIVE   Influenza A by PCR NEGATIVE NEGATIVE   Influenza B by PCR NEGATIVE NEGATIVE  Comprehensive metabolic panel  Result Value Ref Range   Sodium 137 135 - 145 mmol/L   Potassium 3.9 3.5 - 5.1 mmol/L   Chloride 103 98 - 111 mmol/L   CO2 24 22 - 32 mmol/L   Glucose, Bld 106 (H) 70 - 99 mg/dL   BUN 12 6 - 20 mg/dL   Creatinine, Ser 0.94 0.61 - 1.24 mg/dL   Calcium 9.5 8.9 - 10.3 mg/dL   Total Protein 7.9 6.5 - 8.1 g/dL   Albumin 4.6 3.5 - 5.0 g/dL   AST 27 15 - 41 U/L   ALT 48 (H) 0 - 44 U/L   Alkaline Phosphatase 72 38 - 126 U/L   Total Bilirubin 0.8 0.3 - 1.2 mg/dL   GFR, Estimated >60 >60 mL/min   Anion gap 10 5 - 15  Ethanol  Result Value Ref Range   Alcohol, Ethyl (B) <51 <02 mg/dL  Salicylate level  Result Value Ref Range   Salicylate Lvl <5.8 (L) 7.0 - 30.0 mg/dL  Acetaminophen level  Result Value Ref Range   Acetaminophen (Tylenol), Serum <10 (L) 10 - 30 ug/mL  cbc  Result Value Ref Range   WBC 9.5 4.0 - 10.5 K/uL   RBC 5.32 4.22 - 5.81 MIL/uL   Hemoglobin 16.3 13.0 - 17.0 g/dL   HCT 46.3 39.0 - 52.0 %   MCV 87.0 80.0 - 100.0 fL   MCH 30.6 26.0 - 34.0 pg   MCHC 35.2 30.0 - 36.0 g/dL   RDW 11.9 11.5 - 15.5 %   Platelets 259 150 - 400 K/uL   nRBC 0.0 0.0 - 0.2 %  Urine Drug Screen, Qualitative  Result Value Ref Range   Tricyclic, Ur Screen NONE DETECTED NONE DETECTED   Amphetamines, Ur Screen NONE DETECTED NONE DETECTED   MDMA (Ecstasy)Ur Screen NONE DETECTED NONE DETECTED   Cocaine Metabolite,Ur [place] NONE DETECTED NONE DETECTED   Opiate, Ur Screen NONE DETECTED NONE DETECTED   Phencyclidine (PCP) Ur S NONE DETECTED NONE  DETECTED   Cannabinoid 50 Ng, Ur [place] NONE DETECTED NONE DETECTED   Barbiturates, Ur Screen NONE DETECTED NONE DETECTED   Benzodiazepine, Ur Scrn NONE DETECTED  NONE DETECTED   Methadone Scn, Ur NONE DETECTED NONE DETECTED      Assessment & Plan:   Problem List Items Addressed This Visit       Cardiovascular and Mediastinum   Primary hypertension    Chronic. Not well controlled.  Patient did not take medication prior to visit.  Follow up in 1 month.  Call sooner if concerns arise.       Relevant Orders   Comp Met (CMET)     Other   Obesity    Chronic. Not well controlled.  Will check testosterone levels due to fatigue.  If low, will send to urology for testosterone replacement.  Recommended eating smaller high protein, low fat meals more frequently and exercising 30 mins a day 5 times a week with a goal of 10-15lb weight loss in the next 3 months. Patient voiced their understanding and motivation to adhere to these recommendations.        MDD (major depressive disorder), recurrent severe, without psychosis ([place]) - Primary    Chronic.  Controlled.  Continue with current medication regimen.  Labs ordered today.  Return to clinic in 3 months for reevaluation.  Call sooner if concerns arise.        Other Visit Diagnoses     Other fatigue       Relevant Orders   Testosterone, free, total([facility])   Need for influenza vaccination       Relevant Orders   Flu Vaccine QUAD 6+ mos PF IM (Fluarix Quad PF) (Completed)        Follow up plan: Return in about 1 month (around 03/19/2022) for BP Check.

## 2022-02-17 NOTE — Assessment & Plan Note (Signed)
Chronic. Not well controlled.  Patient did not take medication prior to visit.  Follow up in 1 month.  Call sooner if concerns arise.

## 2022-02-17 NOTE — Assessment & Plan Note (Signed)
Chronic.  Controlled.  Continue with current medication regimen.  Labs ordered today.  Return to clinic in 3 months for reevaluation.  Call sooner if concerns arise.   

## 2022-02-17 NOTE — Assessment & Plan Note (Signed)
Chronic. Not well controlled.  Will check testosterone levels due to fatigue.  If low, will send to urology for testosterone replacement.  Recommended eating smaller high protein, low fat meals more frequently and exercising 30 mins a day 5 times a week with a goal of 10-15lb weight loss in the next 3 months. Patient voiced their understanding and motivation to adhere to these recommendations.

## 2022-02-18 NOTE — Addendum Note (Signed)
Addended by: Larae Grooms on: 02/18/2022 09:51 AM   Modules accepted: Orders

## 2022-02-18 NOTE — Progress Notes (Signed)
Please let patient know that his testosterone is low.  I have placed a referral for him to see Urology.

## 2022-02-20 ENCOUNTER — Other Ambulatory Visit: Payer: Self-pay | Admitting: Nurse Practitioner

## 2022-02-21 LAB — COMPREHENSIVE METABOLIC PANEL
ALT: 96 IU/L — ABNORMAL HIGH (ref 0–44)
AST: 37 IU/L (ref 0–40)
Albumin/Globulin Ratio: 1.6 (ref 1.2–2.2)
Albumin: 4.6 g/dL (ref 4.1–5.1)
Alkaline Phosphatase: 81 IU/L (ref 44–121)
BUN/Creatinine Ratio: 12 (ref 9–20)
BUN: 12 mg/dL (ref 6–20)
Bilirubin Total: 0.4 mg/dL (ref 0.0–1.2)
CO2: 24 mmol/L (ref 20–29)
Calcium: 10.9 mg/dL — ABNORMAL HIGH (ref 8.7–10.2)
Chloride: 100 mmol/L (ref 96–106)
Creatinine, Ser: 1.02 mg/dL (ref 0.76–1.27)
Globulin, Total: 2.8 g/dL (ref 1.5–4.5)
Glucose: 102 mg/dL — ABNORMAL HIGH (ref 70–99)
Potassium: 4.3 mmol/L (ref 3.5–5.2)
Sodium: 139 mmol/L (ref 134–144)
Total Protein: 7.4 g/dL (ref 6.0–8.5)
eGFR: 101 mL/min/{1.73_m2} (ref >59)

## 2022-02-21 LAB — TESTOSTERONE, FREE, TOTAL, SHBG
Sex Hormone Binding: 15.7 nmol/L — ABNORMAL LOW (ref 16.5–55.9)
Testosterone, Free: 8.2 pg/mL — ABNORMAL LOW (ref 8.7–25.1)
Testosterone: 238 ng/dL — ABNORMAL LOW (ref 264–916)

## 2022-02-23 NOTE — Telephone Encounter (Signed)
Requested Prescriptions  Pending Prescriptions Disp Refills   valsartan (DIOVAN) 80 MG tablet [Pharmacy Med Name: VALSARTAN 80 MG TABLET] 90 tablet 1    Sig: TAKE 1 TABLET BY MOUTH EVERY DAY     Cardiovascular:  Angiotensin Receptor Blockers Failed - 02/20/2022  4:50 PM      Failed - Last BP in normal range    BP Readings from Last 1 Encounters:  02/17/22 (!) 156/103         Passed - Cr in normal range and within 180 days    Creatinine, Ser  Date Value Ref Range Status  02/17/2022 1.02 0.76 - 1.27 mg/dL Final         Passed - K in normal range and within 180 days    Potassium  Date Value Ref Range Status  02/17/2022 4.3 3.5 - 5.2 mmol/L Final         Passed - Patient is not pregnant      Passed - Valid encounter within last 6 months    Recent Outpatient Visits           6 days ago MDD (major depressive disorder), recurrent severe, without psychosis (H[facility])   [facility] Larae Grooms, NP   3 months ago Fluid level behind tympanic membrane of both ears   [facility] [place], Clydie Braun, NP   4 months ago MDD (major depressive disorder), recurrent severe, without psychosis (HCC)   [facility] Larae Grooms, NP   5 months ago Severe major depression, single episode, with psychotic features (HCC)   [facility] Larae Grooms, NP   6 months ago Severe major depression, single episode, with psychotic features (HCC)   [facility] Larae Grooms, NP       Future Appointments             In 3 weeks Larae Grooms, NP [facility], [facility]

## 2022-03-18 NOTE — Progress Notes (Deleted)
   There were no vitals taken for this visit.   Subjective:    Patient ID: William Reid, male    DOB: [birth year], 31 y.o.   MRN: [id number]  HPI: William Reid is a 31 y.o. male  No chief complaint on file.  HYPERTENSION {Blank single:19197::"without","with"} Chronic Kidney Disease Hypertension status: {Blank single:19197::"controlled","uncontrolled","better","worse","exacerbated","stable"}  Satisfied with current treatment? {Blank single:19197::"yes","no"} Duration of hypertension: {Blank single:19197::"chronic","months","years"} BP monitoring frequency:  {Blank single:19197::"not checking","rarely","daily","weekly","monthly","a few times a day","a few times a week","a few times a month"} BP range:  BP medication side effects:  {Blank single:19197::"yes","no"} Medication compliance: {Blank single:19197::"excellent compliance","good compliance","fair compliance","poor compliance"} Previous BP meds:{Blank QHUTMLYY:50354::"SFKC","LEXNTZGYFV","CBSWHQPRFF/MBWGYKZLDJ","TTSVXBLT","JQZESPQZRA","QTMAUQJFHL/KTGY","BWLSLHTDSK (bystolic)","carvedilol","chlorthalidone","clonidine","diltiazem","exforge HCT","HCTZ","irbesartan (avapro)","labetalol","lisinopril","lisinopril-HCTZ","losartan (cozaar)","methyldopa","nifedipine","olmesartan (benicar)","olmesartan-HCTZ","quinapril","ramipril","spironalactone","tekturna","valsartan","valsartan-HCTZ","verapamil"} Aspirin: {Blank single:19197::"yes","no"} Recurrent headaches: {Blank single:19197::"yes","no"} Visual changes: {Blank single:19197::"yes","no"} Palpitations: {Blank single:19197::"yes","no"} Dyspnea: {Blank single:19197::"yes","no"} Chest pain: {Blank single:19197::"yes","no"} Lower extremity edema: {Blank single:19197::"yes","no"} Dizzy/lightheaded: {Blank single:19197::"yes","no"}  Relevant past medical, surgical, family and social history reviewed and updated as indicated. Interim medical history since our last visit reviewed. Allergies and  medications reviewed and updated.  Review of Systems  Per HPI unless specifically indicated above     Objective:    There were no vitals taken for this visit.  Wt Readings from Last 3 Encounters:  02/17/22 (!) 414 lb 9.6 oz (188.1 kg)  11/20/21 (!) 395 lb 12.8 oz (179.5 kg)  10/06/21 (!) 382 lb (173.3 kg)    Physical Exam  Results for orders placed or performed in visit on 02/17/22  Testosterone, free, total([facility])  Result Value Ref Range   Testosterone 238 (L) 264 - 916 ng/dL   Testosterone, Free 8.2 (L) 8.7 - 25.1 pg/mL   Sex Hormone Binding 15.7 (L) 16.5 - 55.9 nmol/L  Comp Met (CMET)  Result Value Ref Range   Glucose 102 (H) 70 - 99 mg/dL   BUN 12 6 - 20 mg/dL   Creatinine, Ser 1.02 0.76 - 1.27 mg/dL   eGFR 101 >59 mL/min/1.73   BUN/Creatinine Ratio 12 9 - 20   Sodium 139 134 - 144 mmol/L   Potassium 4.3 3.5 - 5.2 mmol/L   Chloride 100 96 - 106 mmol/L   CO2 24 20 - 29 mmol/L   Calcium 10.9 (H) 8.7 - 10.2 mg/dL   Total Protein 7.4 6.0 - 8.5 g/dL   Albumin 4.6 4.1 - 5.1 g/dL   Globulin, Total 2.8 1.5 - 4.5 g/dL   Albumin/Globulin Ratio 1.6 1.2 - 2.2   Bilirubin Total 0.4 0.0 - 1.2 mg/dL   Alkaline Phosphatase 81 44 - 121 IU/L   AST 37 0 - 40 IU/L   ALT 96 (H) 0 - 44 IU/L      Assessment & Plan:   Problem List Items Addressed This Visit   None    Follow up plan: No follow-ups on file.

## 2022-03-19 ENCOUNTER — Ambulatory Visit: Payer: BC Managed Care – PPO | Admitting: Nurse Practitioner

## 2022-04-02 ENCOUNTER — Ambulatory Visit: Payer: BC Managed Care – PPO | Admitting: Nurse Practitioner

## 2022-04-02 NOTE — Progress Notes (Deleted)
There were no vitals taken for this visit.   Subjective:    Patient ID: William Reid, male    DOB: 12/11/1990, 32 y.o.   MRN: [id number]  HPI: William Reid is a 32 y.o. male  No chief complaint on file.  WEIGHT GAIN Duration: years Previous attempts at weight loss: no Complications of obesity: htn Peak weight: 414 lb Weight loss goal: 350lb Weight loss to date: 1 year Requesting obesity pharmacotherapy: yes Current weight loss supplements/medications: no Previous weight loss supplements/meds: no Calories:  Patient states he has a lot of fatigue and lacks the energy to go to the gym.    DEPRESSION/ANXIETY Patient states he restarted his medications shortly after our last visit. Feels like her is doing well.  HYPERTENSION without Chronic Kidney Disease Hypertension status: controlled  Satisfied with current treatment? yes Duration of hypertension: years BP monitoring frequency:  not checking BP range:  BP medication side effects:  no Medication compliance: excellent compliance Previous BP meds:valsartan Aspirin: no Recurrent headaches: no Visual changes: no Palpitations: no Dyspnea: no Chest pain: no Lower extremity edema: no Dizzy/lightheaded: no   Relevant past medical, surgical, family and social history reviewed and updated as indicated. Interim medical history since our last visit reviewed. Allergies and medications reviewed and updated.  Review of Systems  Constitutional:  Positive for fatigue and unexpected weight change.  Eyes:  Negative for visual disturbance.  Respiratory:  Negative for shortness of breath.   Cardiovascular:  Negative for chest pain and leg swelling.  Neurological:  Negative for light-headedness and headaches.  Psychiatric/Behavioral:  Positive for dysphoric mood. Negative for suicidal ideas. The patient is nervous/anxious.     Per HPI unless specifically indicated above     Objective:    There were no vitals taken for  this visit.  Wt Readings from Last 3 Encounters:  02/17/22 (!) 414 lb 9.6 oz (188.1 kg)  11/20/21 (!) 395 lb 12.8 oz (179.5 kg)  10/06/21 (!) 382 lb (173.3 kg)    Physical Exam Vitals and nursing note reviewed.  Constitutional:      General: He is not in acute distress.    Appearance: Normal appearance. He is obese. He is not ill-appearing, toxic-appearing or diaphoretic.  HENT:     Head: Normocephalic.     Right Ear: External ear normal.     Left Ear: External ear normal.     Nose: Nose normal. No congestion or rhinorrhea.     Mouth/Throat:     Mouth: Mucous membranes are moist.  Eyes:     General:        Right eye: No discharge.        Left eye: No discharge.     Extraocular Movements: Extraocular movements intact.     Conjunctiva/sclera: Conjunctivae normal.     Pupils: Pupils are equal, round, and reactive to light.  Cardiovascular:     Rate and Rhythm: Normal rate and regular rhythm.     Heart sounds: No murmur heard. Pulmonary:     Effort: Pulmonary effort is normal. No respiratory distress.     Breath sounds: Normal breath sounds. No wheezing, rhonchi or rales.  Abdominal:     General: Abdomen is flat. Bowel sounds are normal.  Musculoskeletal:     Cervical back: Normal range of motion and neck supple.  Skin:    General: Skin is warm and dry.     Capillary Refill: Capillary refill takes less than 2 seconds.  Neurological:  General: No focal deficit present.     Mental Status: He is alert and oriented to person, place, and time.  Psychiatric:        Mood and Affect: Mood normal.        Behavior: Behavior normal.        Thought Content: Thought content normal.        Judgment: Judgment normal.    Results for orders placed or performed in visit on 02/17/22  Testosterone, free, total([facility])  Result Value Ref Range   Testosterone 238 (L) 264 - 916 ng/dL   Testosterone, Free 8.2 (L) 8.7 - 25.1 pg/mL   Sex Hormone Binding 15.7 (L) 16.5 - 55.9 nmol/L   Comp Met (CMET)  Result Value Ref Range   Glucose 102 (H) 70 - 99 mg/dL   BUN 12 6 - 20 mg/dL   Creatinine, Ser 1.02 0.76 - 1.27 mg/dL   eGFR 101 >59 mL/min/1.73   BUN/Creatinine Ratio 12 9 - 20   Sodium 139 134 - 144 mmol/L   Potassium 4.3 3.5 - 5.2 mmol/L   Chloride 100 96 - 106 mmol/L   CO2 24 20 - 29 mmol/L   Calcium 10.9 (H) 8.7 - 10.2 mg/dL   Total Protein 7.4 6.0 - 8.5 g/dL   Albumin 4.6 4.1 - 5.1 g/dL   Globulin, Total 2.8 1.5 - 4.5 g/dL   Albumin/Globulin Ratio 1.6 1.2 - 2.2   Bilirubin Total 0.4 0.0 - 1.2 mg/dL   Alkaline Phosphatase 81 44 - 121 IU/L   AST 37 0 - 40 IU/L   ALT 96 (H) 0 - 44 IU/L      Assessment & Plan:   Problem List Items Addressed This Visit      Cardiovascular and Mediastinum   Primary hypertension - Primary     Follow up plan: No follow-ups on file.

## 2022-04-20 ENCOUNTER — Other Ambulatory Visit: Payer: Self-pay | Admitting: Nurse Practitioner

## 2022-04-21 ENCOUNTER — Ambulatory Visit: Payer: BC Managed Care – PPO | Admitting: Nurse Practitioner

## 2022-04-21 ENCOUNTER — Encounter: Payer: Self-pay | Admitting: Nurse Practitioner

## 2022-04-21 VITALS — BP 137/82 | HR 99 | Temp 98.2°F | Wt >= 6400 oz

## 2022-04-21 DIAGNOSIS — I10 Essential (primary) hypertension: Secondary | ICD-10-CM

## 2022-04-21 DIAGNOSIS — R7309 Other abnormal glucose: Secondary | ICD-10-CM | POA: Diagnosis not present

## 2022-04-21 DIAGNOSIS — R197 Diarrhea, unspecified: Secondary | ICD-10-CM | POA: Diagnosis not present

## 2022-04-21 NOTE — Assessment & Plan Note (Signed)
Chronic.  Controlled.  Continue with current medication regimen of Valsartan 80mg  daily.  Return to clinic in 6 months for reevaluation.  Call sooner if concerns arise.

## 2022-04-21 NOTE — Telephone Encounter (Signed)
Requested Prescriptions  Pending Prescriptions Disp Refills   FLUoxetine (PROZAC) 40 MG capsule [Pharmacy Med Name: FLUOXETINE HCL 40 MG CAPSULE] 90 capsule 0    Sig: TAKE 1 CAPSULE (40 MG TOTAL) BY MOUTH DAILY.     Psychiatry:  Antidepressants - SSRI Passed - 04/20/2022  1:18 AM      Passed - Completed PHQ-2 or PHQ-9 in the last 360 days      Passed - Valid encounter within last 6 months    Recent Outpatient Visits           2 months ago MDD (major depressive disorder), recurrent severe, without psychosis ([place])   [place] Jon Billings, NP   5 months ago Fluid level behind tympanic membrane of both ears   [place], NP   6 months ago MDD (major depressive disorder), recurrent severe, without psychosis ([place])   [employer] Jon Billings, NP   7 months ago Severe major depression, single episode, with psychotic features [facility])   [place] Jon Billings, NP   8 months ago Severe major depression, single episode, with psychotic features [facility])   [place] Jon Billings, NP       Future Appointments             Today Jon Billings, NP [place], [facility]

## 2022-04-21 NOTE — Patient Instructions (Signed)
[facility]rology

## 2022-04-21 NOTE — Progress Notes (Signed)
BP 137/82   Pulse 99   Temp 98.2 F (36.8 C) (Oral)   Wt (!) 416 lb 3.2 oz (188.8 kg)   SpO2 97%   BMI 53.41 kg/m    Subjective:    Patient ID: William Reid, male    DOB: July 08, 1990, 32 y.o.   MRN: [id number]  HPI: William Reid is a 32 y.o. male  Chief Complaint  Patient presents with   Hypertension   WEIGHT GAIN Duration: years Previous attempts at weight loss: no Complications of obesity: htn Peak weight: 414 lb Weight loss goal: 350lb Weight loss to date: 1 year Requesting obesity pharmacotherapy: yes Current weight loss supplements/medications: no Previous weight loss supplements/meds: no Calories:  Patient states he has a lot of fatigue and lacks the energy to go to the gym.   HYPERTENSION without Chronic Kidney Disease Hypertension status: controlled  Satisfied with current treatment? yes Duration of hypertension: years BP monitoring frequency:  not checking BP range:  BP medication side effects:  no Medication compliance: excellent compliance Previous BP meds:valsartan Aspirin: no Recurrent headaches: no Visual changes: no Palpitations: no Dyspnea: no Chest pain: no Lower extremity edema: no Dizzy/lightheaded: no  Patient states he is having a lot of diarrhea.  He has tried to change his diet which hasn't really helped.  Wondering if he has IBS.  Relevant past medical, surgical, family and social history reviewed and updated as indicated. Interim medical history since our last visit reviewed. Allergies and medications reviewed and updated.  Review of Systems  Eyes:  Negative for visual disturbance.  Respiratory:  Negative for shortness of breath.   Cardiovascular:  Negative for chest pain and leg swelling.  Gastrointestinal:  Positive for abdominal pain and diarrhea.  Neurological:  Negative for light-headedness and headaches.    Per HPI unless specifically indicated above     Objective:    BP 137/82   Pulse 99   Temp 98.2 F (36.8  C) (Oral)   Wt (!) 416 lb 3.2 oz (188.8 kg)   SpO2 97%   BMI 53.41 kg/m   Wt Readings from Last 3 Encounters:  04/21/22 (!) 416 lb 3.2 oz (188.8 kg)  02/17/22 (!) 414 lb 9.6 oz (188.1 kg)  11/20/21 (!) 395 lb 12.8 oz (179.5 kg)    Physical Exam Vitals and nursing note reviewed.  Constitutional:      General: He is not in acute distress.    Appearance: Normal appearance. He is obese. He is not ill-appearing, toxic-appearing or diaphoretic.  HENT:     Head: Normocephalic.     Right Ear: External ear normal.     Left Ear: External ear normal.     Nose: Nose normal. No congestion or rhinorrhea.     Mouth/Throat:     Mouth: Mucous membranes are moist.  Eyes:     General:        Right eye: No discharge.        Left eye: No discharge.     Extraocular Movements: Extraocular movements intact.     Conjunctiva/sclera: Conjunctivae normal.     Pupils: Pupils are equal, round, and reactive to light.  Cardiovascular:     Rate and Rhythm: Normal rate and regular rhythm.     Heart sounds: No murmur heard. Pulmonary:     Effort: Pulmonary effort is normal. No respiratory distress.     Breath sounds: Normal breath sounds. No wheezing, rhonchi or rales.  Abdominal:     General: Abdomen  is flat. Bowel sounds are normal. There is no distension.     Tenderness: There is no abdominal tenderness. There is no right CVA tenderness, left CVA tenderness or guarding.  Musculoskeletal:     Cervical back: Normal range of motion and neck supple.  Skin:    General: Skin is warm and dry.     Capillary Refill: Capillary refill takes less than 2 seconds.  Neurological:     General: No focal deficit present.     Mental Status: He is alert and oriented to person, place, and time.  Psychiatric:        Mood and Affect: Mood normal.        Behavior: Behavior normal.        Thought Content: Thought content normal.        Judgment: Judgment normal.     Results for orders placed or performed in visit on  02/17/22  Testosterone, free, total([facility])  Result Value Ref Range   Testosterone 238 (L) 264 - 916 ng/dL   Testosterone, Free 8.2 (L) 8.7 - 25.1 pg/mL   Sex Hormone Binding 15.7 (L) 16.5 - 55.9 nmol/L  Comp Met (CMET)  Result Value Ref Range   Glucose 102 (H) 70 - 99 mg/dL   BUN 12 6 - 20 mg/dL   Creatinine, Ser 1.02 0.76 - 1.27 mg/dL   eGFR 101 >59 mL/min/1.73   BUN/Creatinine Ratio 12 9 - 20   Sodium 139 134 - 144 mmol/L   Potassium 4.3 3.5 - 5.2 mmol/L   Chloride 100 96 - 106 mmol/L   CO2 24 20 - 29 mmol/L   Calcium 10.9 (H) 8.7 - 10.2 mg/dL   Total Protein 7.4 6.0 - 8.5 g/dL   Albumin 4.6 4.1 - 5.1 g/dL   Globulin, Total 2.8 1.5 - 4.5 g/dL   Albumin/Globulin Ratio 1.6 1.2 - 2.2   Bilirubin Total 0.4 0.0 - 1.2 mg/dL   Alkaline Phosphatase 81 44 - 121 IU/L   AST 37 0 - 40 IU/L   ALT 96 (H) 0 - 44 IU/L      Assessment & Plan:   Problem List Items Addressed This Visit       Cardiovascular and Mediastinum   Primary hypertension - Primary    Chronic.  Controlled.  Continue with current medication regimen of Valsartan 80mg  daily.  Return to clinic in 6 months for reevaluation.  Call sooner if concerns arise.        Other Visit Diagnoses     Elevated glucose       Labs ordered today. Will make recommendations based on lab results.   Relevant Orders   HgB A1c   Diarrhea, unspecified type       Recommend seeing GI for further evaluation and treatment.   Relevant Orders   Ambulatory referral to Gastroenterology        Follow up plan: Return in about 1 month (around 05/22/2022) for Weight Managment.

## 2022-04-22 LAB — HEMOGLOBIN A1C
Est. average glucose Bld gHb Est-mCnc: 128 mg/dL
Hgb A1c MFr Bld: 6.1 % — ABNORMAL HIGH (ref 4.8–5.6)

## 2022-04-22 NOTE — Progress Notes (Signed)
Please let patient know that he is prediabetic.  We will discuss this further at his next visit.  I recommend following a low carb diet and exercising.

## 2022-04-23 ENCOUNTER — Ambulatory Visit: Payer: BC Managed Care – PPO | Admitting: Urology

## 2022-05-14 ENCOUNTER — Encounter: Payer: Self-pay | Admitting: Gastroenterology

## 2022-05-14 ENCOUNTER — Telehealth (INDEPENDENT_AMBULATORY_CARE_PROVIDER_SITE_OTHER): Payer: BC Managed Care – PPO | Admitting: Gastroenterology

## 2022-05-14 DIAGNOSIS — K580 Irritable bowel syndrome with diarrhea: Secondary | ICD-10-CM | POA: Diagnosis not present

## 2022-05-14 DIAGNOSIS — K219 Gastro-esophageal reflux disease without esophagitis: Secondary | ICD-10-CM | POA: Diagnosis not present

## 2022-05-14 MED ORDER — PANTOPRAZOLE SODIUM 40 MG PO TBEC: 40.0000 mg | DELAYED_RELEASE_TABLET | Freq: Every day | ORAL | 5 refills | Status: DC

## 2022-05-14 NOTE — Progress Notes (Signed)
Lucilla Lame, MD [address]  [place]  [place], [place] [address]  Main: [phone]  Fax: [phone]    Gastroenterology Virtual/Video Visit  Referring Provider:     Jon Billings, NP Primary Care Physician:  Jon Billings, NP Primary Gastroenterologist:  Dr.Duyen Beckom [facility] Reason for Consultation:     Diarrhea        HPI:    Virtual Visit via Video Note Location of the patient: Home Location of provider: Home Participating persons: The patient and myself.  I connected with William Reid on 05/14/22 at  3:00 PM EST by a video enabled telemedicine application and verified that I am speaking with the correct person using two identifiers.   I discussed the limitations of evaluation and management by telemedicine and the availability of in person appointments. The patient expressed understanding and agreed to proceed.  Verbal consent to proceed obtained.  History of Present Illness: William Reid is a 32 y.o. male referred by Dr. Jon Billings, NP  for consultation & management of diarrhea.  This patient comes to see me after being seen by his primary care provider in January with a report of diarrhea.  The patient's primary care provider was concerned that the patient may be suffering from irritable bowel syndrome. He reports having to run to the bathroom just after eating. He reports that after eating the wrong thing he gets heartburn very bad in the middle of the night. The patient reports that he has heartburn on average 2 times a week in addition to these episodes of choking while he sleeping.  He also reports that his diarrhea is usually right after he eats and only happens a few times a week with normal bowel movements between the attacks of diarrhea.  Past Medical History:  Diagnosis Date   Allergy    Seizures ([place])     No past surgical history on file.  Prior to Admission medications   Medication Sig Start Date End Date Taking? Authorizing  Provider  ARIPiprazole (ABILIFY) 5 MG tablet Take 1 tablet (5 mg total) by mouth daily. 09/03/21   Jon Billings, NP  busPIRone (BUSPAR) 10 MG tablet TAKE 1 TABLET BY MOUTH TWICE A DAY 11/04/21   Jon Billings, NP  FLUoxetine (PROZAC) 40 MG capsule TAKE 1 CAPSULE (40 MG TOTAL) BY MOUTH DAILY. 04/21/22   Jon Billings, NP  valsartan (DIOVAN) 80 MG tablet TAKE 1 TABLET BY MOUTH EVERY DAY 02/23/22   Jon Billings, NP    Family History  Problem Relation Age of Onset   Epilepsy Father    Hypertension Father      Social History   Tobacco Use   Smoking status: Former   Smokeless tobacco: Never  Vaping Use   Vaping Use: Never used  Substance Use Topics   Alcohol use: Yes    Comment: occasionally   Drug use: Yes    Types: Marijuana    Allergies as of 05/14/2022   (No Known Allergies)    Review of Systems:    All systems reviewed and negative except where noted in HPI.   Observations/Objective:  Labs: CBC    Component Value Date/Time   WBC 9.5 01/02/2021 1405   RBC 5.32 01/02/2021 1405   HGB 16.3 01/02/2021 1405   HGB 15.3 08/31/2019 1450   HCT 46.3 01/02/2021 1405   HCT 46.3 08/31/2019 1450   PLT 259 01/02/2021 1405   PLT 239 08/31/2019 1450   MCV 87.0 01/02/2021 1405  MCV 88 08/31/2019 1450   MCH 30.6 01/02/2021 1405   MCHC 35.2 01/02/2021 1405   RDW 11.9 01/02/2021 1405   RDW 12.3 08/31/2019 1450   LYMPHSABS 2.2 08/31/2019 1450   EOSABS 0.3 08/31/2019 1450   BASOSABS 0.1 08/31/2019 1450   CMP     Component Value Date/Time   NA 139 02/17/2022 0900   K 4.3 02/17/2022 0900   CL 100 02/17/2022 0900   CO2 24 02/17/2022 0900   GLUCOSE 102 (H) 02/17/2022 0900   GLUCOSE 106 (H) 01/02/2021 1405   BUN 12 02/17/2022 0900   CREATININE 1.02 02/17/2022 0900   CALCIUM 10.9 (H) 02/17/2022 0900   PROT 7.4 02/17/2022 0900   ALBUMIN 4.6 02/17/2022 0900   AST 37 02/17/2022 0900   ALT 96 (H) 02/17/2022 0900   ALKPHOS 81 02/17/2022 0900   BILITOT 0.4  02/17/2022 0900   GFRNONAA >60 01/02/2021 1405   GFRAA 135 08/31/2019 1450    Imaging Studies: No results found.  Assessment and Plan:   William Reid is a 32 y.o. y/o male has been referred for who comes in with symptoms consistent with irritable bowel syndrome with diarrhea predominance with urgency after he eats.  The patient has been told to go on a low FODMAP diet.  The patient also has GERD with regurgitation and has been told that I like to try him on Protonix 40 mg a day to see if that helps his symptoms.  The patient has been told to contact me through MyChart if his symptoms do not improve.  If his irritable bowel syndrome does not get better then he may be started on dicyclomine to try and decrease the urgency to go to the bathroom right after he eats.  The patient has been explained the plan and agrees with it.  Follow Up Instructions:  I discussed the assessment and treatment plan with the patient. The patient was provided an opportunity to ask questions and all were answered. The patient agreed with the plan and demonstrated an understanding of the instructions.   The patient was advised to call back or seek an in-person evaluation if the symptoms worsen or if the condition fails to improve as anticipated.  I provided 25 minutes of non-face-to-face time during this encounter including chart review In preparation for the encounter.   Lucilla Lame, MD  Speech recognition software was used to dictate the above note.

## 2022-05-25 ENCOUNTER — Ambulatory Visit: Payer: BC Managed Care – PPO | Admitting: Nurse Practitioner

## 2022-05-25 NOTE — Progress Notes (Deleted)
There were no vitals taken for this visit.   Subjective:    Patient ID: William Reid, male    DOB: [birth year], 32 y.o.   MRN: [id number]  HPI: William Reid is a 32 y.o. male  No chief complaint on file.  WEIGHT GAIN Duration: years Previous attempts at weight loss: no Complications of obesity: htn Peak weight: 414 lb Weight loss goal: 350lb Weight loss to date: 1 year Requesting obesity pharmacotherapy: yes Current weight loss supplements/medications: no Previous weight loss supplements/meds: no Calories:  Patient states he has a lot of fatigue and lacks the energy to go to the gym.   HYPERTENSION without Chronic Kidney Disease Hypertension status: controlled  Satisfied with current treatment? yes Duration of hypertension: years BP monitoring frequency:  not checking BP range:  BP medication side effects:  no Medication compliance: excellent compliance Previous BP meds:valsartan Aspirin: no Recurrent headaches: no Visual changes: no Palpitations: no Dyspnea: no Chest pain: no Lower extremity edema: no Dizzy/lightheaded: no  Patient states he is having a lot of diarrhea.  He has tried to change his diet which hasn't really helped.  Wondering if he has IBS.  Relevant past medical, surgical, family and social history reviewed and updated as indicated. Interim medical history since our last visit reviewed. Allergies and medications reviewed and updated.  Review of Systems  Eyes:  Negative for visual disturbance.  Respiratory:  Negative for shortness of breath.   Cardiovascular:  Negative for chest pain and leg swelling.  Gastrointestinal:  Positive for abdominal pain and diarrhea.  Neurological:  Negative for light-headedness and headaches.    Per HPI unless specifically indicated above     Objective:    There were no vitals taken for this visit.  Wt Readings from Last 3 Encounters:  04/21/22 (!) 416 lb 3.2 oz (188.8 kg)  02/17/22 (!) 414 lb 9.6 oz  (188.1 kg)  11/20/21 (!) 395 lb 12.8 oz (179.5 kg)    Physical Exam Vitals and nursing note reviewed.  Constitutional:      General: He is not in acute distress.    Appearance: Normal appearance. He is obese. He is not ill-appearing, toxic-appearing or diaphoretic.  HENT:     Head: Normocephalic.     Right Ear: External ear normal.     Left Ear: External ear normal.     Nose: Nose normal. No congestion or rhinorrhea.     Mouth/Throat:     Mouth: Mucous membranes are moist.  Eyes:     General:        Right eye: No discharge.        Left eye: No discharge.     Extraocular Movements: Extraocular movements intact.     Conjunctiva/sclera: Conjunctivae normal.     Pupils: Pupils are equal, round, and reactive to light.  Cardiovascular:     Rate and Rhythm: Normal rate and regular rhythm.     Heart sounds: No murmur heard. Pulmonary:     Effort: Pulmonary effort is normal. No respiratory distress.     Breath sounds: Normal breath sounds. No wheezing, rhonchi or rales.  Abdominal:     General: Abdomen is flat. Bowel sounds are normal. There is no distension.     Tenderness: There is no abdominal tenderness. There is no right CVA tenderness, left CVA tenderness or guarding.  Musculoskeletal:     Cervical back: Normal range of motion and neck supple.  Skin:    General: Skin is warm and dry.  Capillary Refill: Capillary refill takes less than 2 seconds.  Neurological:     General: No focal deficit present.     Mental Status: He is alert and oriented to person, place, and time.  Psychiatric:        Mood and Affect: Mood normal.        Behavior: Behavior normal.        Thought Content: Thought content normal.        Judgment: Judgment normal.    Results for orders placed or performed in visit on 04/21/22  HgB A1c  Result Value Ref Range   Hgb A1c MFr Bld 6.1 (H) 4.8 - 5.6 %   Est. average glucose Bld gHb Est-mCnc 128 mg/dL      Assessment & Plan:   Problem List Items  Addressed This Visit      Other   Obesity - Primary     Follow up plan: No follow-ups on file.

## 2022-05-27 ENCOUNTER — Ambulatory Visit (INDEPENDENT_AMBULATORY_CARE_PROVIDER_SITE_OTHER): Payer: BC Managed Care – PPO | Admitting: Urology

## 2022-05-27 VITALS — BP 156/95 | HR 80 | Ht 75.0 in | Wt >= 6400 oz

## 2022-05-27 DIAGNOSIS — E291 Testicular hypofunction: Secondary | ICD-10-CM | POA: Diagnosis not present

## 2022-05-27 NOTE — Progress Notes (Signed)
   05/27/22 4:13 PM   William Reid 1990-09-04 [id number]  CC: Low testosterone  HPI: 32 year old male with morbid obesity BMI 53 referred for a low testosterone of 238 from November 2023.  He reports symptoms of decreased energy during the day, low motivation.  He has trouble sleeping and thinks he might have sleep apnea but has not been tested.  He does not have any children currently, but is interested in children in the future.   PMH: Past Medical History:  Diagnosis Date   Allergy    Seizures ([place])    Family History: Family History  Problem Relation Age of Onset   Epilepsy Father    Hypertension Father     Social History:  reports that he has quit smoking. He has never used smokeless tobacco. He reports current alcohol use. He reports current drug use. Drug: Marijuana.  Physical Exam: BP (!) 156/95   Pulse 80   Ht 6' 3"$  (1.905 m)   Wt (!) 420 lb (190.5 kg)   BMI 52.50 kg/m    Constitutional:  Alert and oriented, No acute distress. Cardiovascular: No clubbing, cyanosis, or edema. Respiratory: Normal respiratory effort, no increased work of breathing. GI: Abdomen is soft, nontender, nondistended, no abdominal masses  Assessment & Plan:   32 year old male with morbid obesity and BMI 53 referred for low testosterone of 238.  He is interested in preserving fertility and having children in the future.  We had a long conversation about the AUA guidelines regarding evaluation and management of patients with low testosterone, as well as the impact of exogenous testosterone on decreased fertility.  We also reviewed alternative options like Clomid, but he feels this is cost prohibitive for him at this time.  We focused primarily on lifestyle changes including weight loss, healthy eating, exercise, and I also strongly recommended referral for sleep apnea evaluation.  Referral placed for sleep apnea evaluation Lifestyle factors reviewed extensively RTC 6 months, repeat morning  testosterone prior  William Madrid, MD 05/27/2022  [facility] [address] [place], [place] [address] [phone]

## 2022-05-27 NOTE — Patient Instructions (Signed)
The Benefits of a Plant-Based Diet for Urology Health  A plant-based diet emphasizes the consumption of whole, unprocessed plant foods while minimizing or excluding animal products including meat and dairy products. This dietary approach has gained attention for its potential to promote overall health, including urology-related conditions. Incorporating a plant-based diet into your lifestyle can offer numerous benefits for maintaining optimal urology health.  1. Reduced Risk of Kidney Stones: A plant-based diet is typically rich in fruits, vegetables, legumes, and whole grains. These foods are high in dietary fiber, potassium, and magnesium, which can help reduce the risk of developing kidney stones. Be careful to avoid high quantities of spinach, as these can contribute to kidney stone formation if eaten in large volumes. The increased intake of water-soluble fiber can enhance the excretion of waste products and prevent the crystallization of minerals that lead to stone formation.  2. Improved Prostate Health: Studies have suggested a link between the consumption of red and processed meats and an increased risk of prostate problems, including benign prostatic hyperplasia (BPH) and prostate cancer. By adopting a plant-based diet, you can lower your intake of saturated fats and decrease the risk of these conditions. PSA levels can often decrease on plant based diets! Plant foods are also rich in antioxidants and phytochemicals that have been associated with prostate health.  3. Better Bladder Function: A diet focused on plant-based foods can contribute to better bladder health by reducing the risk of urinary tract infections (UTIs). Berries, citrus fruits, and leafy greens are known for their high vitamin C content, which can acidify urine and create an environment less favorable for bacteria growth. Additionally, plant-based diets are generally lower in sodium, which can help prevent fluid retention and  reduce the strain on the bladder.  4. Management of Erectile Dysfunction (ED): Some research suggests that a plant-based diet can positively impact erectile function. Plant-based diets are associated with improved cardiovascular health, which is crucial for maintaining healthy blood flow and nerve function required for proper erectile function. By reducing the consumption of high-cholesterol and high-saturated fat animal products, a plant-based diet may contribute to a decreased risk of ED.  5. Prevention of Chronic Conditions: A plant-based diet can help prevent or manage chronic conditions such as obesity, diabetes, and hypertension. These conditions can contribute to urology-related issues, including urinary incontinence and kidney dysfunction. By maintaining a healthy weight and managing these conditions, you can reduce the risk of urology-related complications.  Conclusion: Embracing a plant-based diet can offer significant benefits for urology health. By incorporating a variety of colorful fruits, vegetables, whole grains, nuts, seeds, and legumes into your meals, you can support kidney health, prostate health, bladder function, and overall well-being. Remember to consult with a healthcare professional or registered dietitian before making any significant dietary changes, especially if you have existing health conditions. Your personalized approach to a plant-based diet can contribute to improved urology health and enhance your quality of life.      Hypogonadism, Male  Male hypogonadism is a condition of having a level of testosterone that is lower than normal. Testosterone is a chemical, or hormone, that is made mainly in the testicles. In boys, testosterone is responsible for the development of male characteristics during puberty. These include: Making the penis bigger. Growing and building the muscles. Growing facial hair. Deepening the voice. In adult men, testosterone is responsible  for maintaining: An interest in sex and the ability to have sex. Muscle mass. Sperm production. Red blood cell production. Bone strength. Testosterone  also gives men energy and a sense of well-being. Testosterone normally decreases as men age and the testicles make less testosterone. Testosterone levels can vary from man to man. Not all men will have signs and symptoms of low testosterone. Weight, alcohol use, medicines, and certain medical conditions can affect a man's testosterone level. What are the causes? This condition is caused by: A natural decrease in testosterone that occurs as a man grows older. This is the main cause of this condition. Use of medicines, such as antidepressants, steroids, and opioids. Diseases and conditions that affect the testicles or the making of testosterone. These include: Injury or damage to the testicles from trauma, cancer, cancer treatment, or infection. Diabetes. Sleep apnea. Genetic conditions that men are born with. Disease of the pituitary gland. This gland is in the brain. It produces hormones. Obesity. Metabolic syndrome. This is a group of diseases that affect blood pressure, blood sugar, cholesterol, and belly fat. HIV or AIDS. Alcohol abuse. Kidney failure. Other long-term or chronic diseases. What are the signs or symptoms? Common symptoms of this condition include: Loss of interest in sex (low sex drive). Inability to have or maintain an erection (erectile dysfunction). Feeling tired (fatigue). Mood changes, like irritability or depression. Loss of muscle and body hair. Infertility. Large breasts. Weight gain (obesity). How is this diagnosed? Your health care provider can diagnose hypogonadism based on: Your signs and symptoms. A physical exam to check your testosterone levels. This includes blood tests. Testosterone levels can change throughout the day. Levels are highest in the morning. You may need to have repeat blood tests  before getting a diagnosis of hypogonadism. Depending on your medical history and test results, your health care provider may also do other tests to find the cause of low testosterone. How is this treated? This condition is treated with testosterone replacement therapy. Testosterone can be given by: Injection or through pellets inserted under the skin. Gels or patches placed on the skin or in the mouth. Testosterone therapy is not for everyone. It has risks and side effects. Your health care provider will consider your medical history, your risk for prostate cancer, your age, and your symptoms before putting you on testosterone replacement therapy. Follow these instructions at home: Take over-the-counter and prescription medicines only as told by your health care provider. Eat foods that are high in fiber, such as beans, whole grains, and fresh fruits and vegetables. Limit foods that are high in fat and processed sugars, such as fried or sweet foods. If you drink alcohol: Limit how much you have to 0-2 drinks a day. Know how much alcohol is in your drink. In the [place] one drink equals one 12 oz bottle of beer (355 mL), one 5 oz glass of wine (148 mL), or one 1 oz glass of hard liquor (44 mL). Return to your normal activities as told by your health care provider. Ask your health care provider what activities are safe for you. Keep all follow-up visits. This is important. Contact a health care provider if: You have any of the signs or symptoms of low testosterone. You have any side effects from testosterone therapy. Summary Male hypogonadism is a condition of having a level of testosterone that is lower than normal. The natural drop in testosterone production that occurs with age is the most common cause of this condition. Low testosterone can also be caused by many diseases and conditions that affect the testicles and the making of testosterone. This condition is treated with testosterone  replacement therapy. There are risks and side effects of testosterone therapy. Your health care provider will consider your age, medical history, symptoms, and risks for prostate cancer before putting you on testosterone therapy. This information is not intended to replace advice given to you by your health care provider. Make sure you discuss any questions you have with your health care provider. Document Revised: 11/16/2019 Document Reviewed: 11/16/2019 Elsevier Patient Education  [place] to Stay Healthy To become healthy and stay healthy, it is recommended that you do moderate-intensity and vigorous-intensity exercise. You can tell that you are exercising at a moderate intensity if your heart starts beating faster and you start breathing faster but can still hold a conversation. You can tell that you are exercising at a vigorous intensity if you are breathing much harder and faster and cannot hold a conversation while exercising. How can exercise benefit me? Exercising regularly is important. It has many health benefits, such as: Improving overall fitness, flexibility, and endurance. Increasing bone density. Helping with weight control. Decreasing body fat. Increasing muscle strength and endurance. Reducing stress and tension, anxiety, depression, or anger. Improving overall health. What guidelines should I follow while exercising? Before you start a new exercise program, talk with your health care provider. Do not exercise so much that you hurt yourself, feel dizzy, or get very short of breath. Wear comfortable clothes and wear shoes with good support. Drink plenty of water while you exercise to prevent dehydration or heat stroke. Work out until your breathing and your heartbeat get faster (moderate intensity). How often should I exercise? Choose an activity that you enjoy, and set realistic goals. Your health care provider can help you make an activity plan that  is individually designed and works best for you. Exercise regularly as told by your health care provider. This may include: Doing strength training two times a week, such as: Lifting weights. Using resistance bands. Push-ups. Sit-ups. Yoga. Doing a certain intensity of exercise for a given amount of time. Choose from these options: A total of 150 minutes of moderate-intensity exercise every week. A total of 75 minutes of vigorous-intensity exercise every week. A mix of moderate-intensity and vigorous-intensity exercise every week. Children, pregnant women, people who have not exercised regularly, people who are overweight, and older adults may need to talk with a health care provider about what activities are safe to perform. If you have a medical condition, be sure to talk with your health care provider before you start a new exercise program. What are some exercise ideas? Moderate-intensity exercise ideas include: Walking 1 mile (1.6 km) in about 15 minutes. Biking. Hiking. Golfing. Dancing. Water aerobics. Vigorous-intensity exercise ideas include: Walking 4.5 miles (7.2 km) or more in about 1 hour. Jogging or running 5 miles (8 km) in about 1 hour. Biking 10 miles (16.1 km) or more in about 1 hour. Lap swimming. Roller-skating or in-line skating. Cross-country skiing. Vigorous competitive sports, such as football, basketball, and soccer. Jumping rope. Aerobic dancing. What are some everyday activities that can help me get exercise? Yard work, such as: Psychologist, educational. Raking and bagging leaves. Washing your car. Pushing a stroller. Shoveling snow. Gardening. Washing windows or floors. How can I be more active in my day-to-day activities? Use stairs instead of an elevator. Take a walk during your lunch break. If you drive, park your car farther away from your work or school. If you take public transportation, get off one stop early and walk the  rest of the  way. Stand up or walk around during all of your indoor phone calls. Get up, stretch, and walk around every 30 minutes throughout the day. Enjoy exercise with a friend. Support to continue exercising will help you keep a regular routine of activity. Where to find more information You can find more information about exercising to stay healthy from: [place] Department of Health and Human Services: [employer].at Centers for Disease Control and Prevention (CDC): http://www.wolf.info/ Summary Exercising regularly is important. It will improve your overall fitness, flexibility, and endurance. Regular exercise will also improve your overall health. It can help you control your weight, reduce stress, and improve your bone density. Do not exercise so much that you hurt yourself, feel dizzy, or get very short of breath. Before you start a new exercise program, talk with your health care provider. This information is not intended to replace advice given to you by your health care provider. Make sure you discuss any questions you have with your health care provider. Document Revised: 07/12/2020 Document Reviewed: 07/12/2020 Elsevier Patient Education  [place].

## 2022-06-02 ENCOUNTER — Ambulatory Visit: Payer: BC Managed Care – PPO | Admitting: Nurse Practitioner

## 2022-06-02 NOTE — Progress Notes (Deleted)
There were no vitals taken for this visit.   Subjective:    Patient ID: William Reid, male    DOB: 12-25-1990, 32 y.o.   MRN: [id number]  HPI: William Reid is a 32 y.o. male  No chief complaint on file.  WEIGHT GAIN Duration: years Previous attempts at weight loss: no Complications of obesity: htn Peak weight: 414 lb Weight loss goal: 350lb Weight loss to date: 1 year Requesting obesity pharmacotherapy: yes Current weight loss supplements/medications: no Previous weight loss supplements/meds: no Calories:  Patient states he has a lot of fatigue and lacks the energy to go to the gym.   HYPERTENSION without Chronic Kidney Disease Hypertension status: controlled  Satisfied with current treatment? yes Duration of hypertension: years BP monitoring frequency:  not checking BP range:  BP medication side effects:  no Medication compliance: excellent compliance Previous BP meds:valsartan Aspirin: no Recurrent headaches: no Visual changes: no Palpitations: no Dyspnea: no Chest pain: no Lower extremity edema: no Dizzy/lightheaded: no  Patient states he is having a lot of diarrhea.  He has tried to change his diet which hasn't really helped.  Wondering if he has IBS.  Relevant past medical, surgical, family and social history reviewed and updated as indicated. Interim medical history since our last visit reviewed. Allergies and medications reviewed and updated.  Review of Systems  Eyes:  Negative for visual disturbance.  Respiratory:  Negative for shortness of breath.   Cardiovascular:  Negative for chest pain and leg swelling.  Gastrointestinal:  Positive for abdominal pain and diarrhea.  Neurological:  Negative for light-headedness and headaches.    Per HPI unless specifically indicated above     Objective:    There were no vitals taken for this visit.  Wt Readings from Last 3 Encounters:  05/27/22 (!) 420 lb (190.5 kg)  04/21/22 (!) 416 lb 3.2 oz (188.8  kg)  02/17/22 (!) 414 lb 9.6 oz (188.1 kg)    Physical Exam Vitals and nursing note reviewed.  Constitutional:      General: He is not in acute distress.    Appearance: Normal appearance. He is obese. He is not ill-appearing, toxic-appearing or diaphoretic.  HENT:     Head: Normocephalic.     Right Ear: External ear normal.     Left Ear: External ear normal.     Nose: Nose normal. No congestion or rhinorrhea.     Mouth/Throat:     Mouth: Mucous membranes are moist.  Eyes:     General:        Right eye: No discharge.        Left eye: No discharge.     Extraocular Movements: Extraocular movements intact.     Conjunctiva/sclera: Conjunctivae normal.     Pupils: Pupils are equal, round, and reactive to light.  Cardiovascular:     Rate and Rhythm: Normal rate and regular rhythm.     Heart sounds: No murmur heard. Pulmonary:     Effort: Pulmonary effort is normal. No respiratory distress.     Breath sounds: Normal breath sounds. No wheezing, rhonchi or rales.  Abdominal:     General: Abdomen is flat. Bowel sounds are normal. There is no distension.     Tenderness: There is no abdominal tenderness. There is no right CVA tenderness, left CVA tenderness or guarding.  Musculoskeletal:     Cervical back: Normal range of motion and neck supple.  Skin:    General: Skin is warm and dry.  Capillary Refill: Capillary refill takes less than 2 seconds.  Neurological:     General: No focal deficit present.     Mental Status: He is alert and oriented to person, place, and time.  Psychiatric:        Mood and Affect: Mood normal.        Behavior: Behavior normal.        Thought Content: Thought content normal.        Judgment: Judgment normal.    Results for orders placed or performed in visit on 04/21/22  HgB A1c  Result Value Ref Range   Hgb A1c MFr Bld 6.1 (H) 4.8 - 5.6 %   Est. average glucose Bld gHb Est-mCnc 128 mg/dL      Assessment & Plan:   Problem List Items Addressed  This Visit   None    Follow up plan: No follow-ups on file.

## 2022-06-07 ENCOUNTER — Other Ambulatory Visit: Payer: Self-pay | Admitting: Nurse Practitioner

## 2022-06-08 NOTE — Telephone Encounter (Signed)
Requested medication (s) are due for refill today: yes   Requested medication (s) are on the active medication list: yes   Last refill:  09/03/21 #90 1 refills  Future visit scheduled: no  Notes to clinic:  not delegated per protocol do you want to refill Rx?     Requested Prescriptions  Pending Prescriptions Disp Refills   ARIPiprazole (ABILIFY) 5 MG tablet [Pharmacy Med Name: ARIPIPRAZOLE 5 MG TABLET] 90 tablet 1    Sig: Take 1 tablet (5 mg total) by mouth daily.     Not Delegated - Psychiatry:  Antipsychotics - Second Generation (Atypical) - aripiprazole Failed - 06/07/2022  7:07 PM      Failed - This refill cannot be delegated      Failed - TSH in normal range and within 360 days    TSH  Date Value Ref Range Status  08/31/2019 0.912 0.450 - 4.500 uIU/mL Final         Failed - Last BP in normal range    BP Readings from Last 1 Encounters:  05/27/22 (!) 156/95         Failed - Lipid Panel in normal range within the last 12 months    Cholesterol, Total  Date Value Ref Range Status  08/31/2019 186 100 - 199 mg/dL Final   LDL Chol Calc (NIH)  Date Value Ref Range Status  08/31/2019 123 (H) 0 - 99 mg/dL Final   HDL  Date Value Ref Range Status  08/31/2019 32 (L) >39 mg/dL Final   Triglycerides  Date Value Ref Range Status  08/31/2019 175 (H) 0 - 149 mg/dL Final         Failed - CBC within normal limits and completed in the last 12 months    WBC  Date Value Ref Range Status  01/02/2021 9.5 4.0 - 10.5 K/uL Final   RBC  Date Value Ref Range Status  01/02/2021 5.32 4.22 - 5.81 MIL/uL Final   Hemoglobin  Date Value Ref Range Status  01/02/2021 16.3 13.0 - 17.0 g/dL Final  08/31/2019 15.3 13.0 - 17.7 g/dL Final   HCT  Date Value Ref Range Status  01/02/2021 46.3 39.0 - 52.0 % Final   Hematocrit  Date Value Ref Range Status  08/31/2019 46.3 37.5 - 51.0 % Final   MCHC  Date Value Ref Range Status  01/02/2021 35.2 30.0 - 36.0 g/dL Final   [facility]  Date  Value Ref Range Status  01/02/2021 30.6 26.0 - 34.0 pg Final   MCV  Date Value Ref Range Status  01/02/2021 87.0 80.0 - 100.0 fL Final  08/31/2019 88 79 - 97 fL Final   No results found for: "PLTCOUNTKUC", "LABPLAT", "POCPLA" RDW  Date Value Ref Range Status  01/02/2021 11.9 11.5 - 15.5 % Final  08/31/2019 12.3 11.6 - 15.4 % Final         Passed - Completed PHQ-2 or PHQ-9 in the last 360 days      Passed - Last Heart Rate in normal range    Pulse Readings from Last 1 Encounters:  05/27/22 80         Passed - Valid encounter within last 6 months    Recent Outpatient Visits           1 month ago Primary hypertension   [place], Karen, NP   3 months ago MDD (major depressive disorder), recurrent severe, without psychosis ([place])   [place],  Santiago Glad, NP   6 months ago Fluid level behind tympanic membrane of both ears   [place] Jon Billings, NP   8 months ago MDD (major depressive disorder), recurrent severe, without psychosis ([place])   [place] Jon Billings, NP   9 months ago Severe major depression, single episode, with psychotic features [facility])   [place] Jon Billings, NP       Future Appointments             In 5 months [place], Herbert Seta, MD [facility] within normal limits and completed in the last 12 months    Albumin  Date Value Ref Range Status  02/17/2022 4.6 4.1 - 5.1 g/dL Final   Alkaline Phosphatase  Date Value Ref Range Status  02/17/2022 81 44 - 121 IU/L Final   ALT  Date Value Ref Range Status  02/17/2022 96 (H) 0 - 44 IU/L Final   AST  Date Value Ref Range Status  02/17/2022 37 0 - 40 IU/L Final   BUN  Date Value Ref Range Status  02/17/2022 12 6 - 20 mg/dL Final   Calcium  Date Value Ref Range Status  02/17/2022 10.9 (H) 8.7  - 10.2 mg/dL Final   CO2  Date Value Ref Range Status  02/17/2022 24 20 - 29 mmol/L Final   Creatinine, Ser  Date Value Ref Range Status  02/17/2022 1.02 0.76 - 1.27 mg/dL Final   Glucose  Date Value Ref Range Status  02/17/2022 102 (H) 70 - 99 mg/dL Final   Glucose, Bld  Date Value Ref Range Status  01/02/2021 106 (H) 70 - 99 mg/dL Final    Comment:    Glucose reference range applies only to samples taken after fasting for at least 8 hours.   Potassium  Date Value Ref Range Status  02/17/2022 4.3 3.5 - 5.2 mmol/L Final   Sodium  Date Value Ref Range Status  02/17/2022 139 134 - 144 mmol/L Final   Bilirubin Total  Date Value Ref Range Status  02/17/2022 0.4 0.0 - 1.2 mg/dL Final   Protein,UA  Date Value Ref Range Status  08/31/2019 Negative Negative/Trace Final   Total Protein  Date Value Ref Range Status  02/17/2022 7.4 6.0 - 8.5 g/dL Final   GFR calc Af Amer  Date Value Ref Range Status  08/31/2019 135 >59 mL/min/1.73 Final    Comment:    **[facility] currently reports eGFR in compliance with the current**   recommendations of the [employer]. [facility] will   update reporting as new guidelines are published from the NKF-ASN   Task force.    eGFR  Date Value Ref Range Status  02/17/2022 101 >59 mL/min/1.73 Final   GFR, Estimated  Date Value Ref Range Status  01/02/2021 >60 >60 mL/min Final    Comment:    (NOTE) Calculated using the CKD-EPI Creatinine Equation (2021)

## 2022-06-17 ENCOUNTER — Ambulatory Visit: Payer: BC Managed Care – PPO | Admitting: Nurse Practitioner

## 2022-06-17 ENCOUNTER — Encounter: Payer: Self-pay | Admitting: Nurse Practitioner

## 2022-06-17 VITALS — BP 150/81 | HR 92 | Temp 98.2°F | Wt >= 6400 oz

## 2022-06-17 DIAGNOSIS — I10 Essential (primary) hypertension: Secondary | ICD-10-CM | POA: Diagnosis not present

## 2022-06-17 DIAGNOSIS — F332 Major depressive disorder, recurrent severe without psychotic features: Secondary | ICD-10-CM

## 2022-06-17 DIAGNOSIS — R7303 Prediabetes: Secondary | ICD-10-CM | POA: Insufficient documentation

## 2022-06-17 DIAGNOSIS — Z6841 Body Mass Index (BMI) 40.0 and over, adult: Secondary | ICD-10-CM | POA: Diagnosis not present

## 2022-06-17 MED ORDER — VALSARTAN 80 MG PO TABS: 80.0000 mg | ORAL_TABLET | Freq: Every day | ORAL | 1 refills | Status: DC

## 2022-06-17 MED ORDER — ARIPIPRAZOLE 5 MG PO TABS: 5.0000 mg | ORAL_TABLET | Freq: Every day | ORAL | 1 refills | Status: DC

## 2022-06-17 MED ORDER — FLUOXETINE HCL 40 MG PO CAPS: 40.0000 mg | ORAL_CAPSULE | Freq: Every day | ORAL | 1 refills | Status: DC

## 2022-06-17 NOTE — Assessment & Plan Note (Signed)
Chronic.  Controlled.  Continue with current medication regimen.  Refills sent today.  Labs ordered today.  Return to clinic in 3 months for reevaluation.  Call sooner if concerns arise.   

## 2022-06-17 NOTE — Assessment & Plan Note (Signed)
Recommended eating smaller high protein, low fat meals more frequently and exercising 30 mins a day 5 times a week with a goal of 10-15lb weight loss in the next 3 months.  

## 2022-06-17 NOTE — Assessment & Plan Note (Signed)
Chronic.  Endorses taking medication but didn't take it today.  Recommend being consistent with blood pressure medication.  Recommend checking blood pressure at home.  CMP checked at visit today.  Follow up in 3 months.  Call sooner if concerns arise.

## 2022-06-17 NOTE — Progress Notes (Signed)
BP (!) 150/81 (BP Location: Left Arm, Cuff Size: Large)   Pulse 92   Temp 98.2 F (36.8 C) (Oral)   Wt (!) 425 lb 3.2 oz (192.9 kg)   SpO2 95%   BMI 53.15 kg/m    Subjective:    Patient ID: William Reid, male    DOB: 28-Aug-1990, 32 y.o.   MRN: [id number]  HPI: William Reid is a 32 y.o. male  Chief Complaint  Patient presents with   Obesity   Depression   Hypertension   WEIGHT GAIN Duration: years Previous attempts at weight loss: no Complications of obesity: htn Peak weight: 425 lb Weight loss goal: 350lb Weight loss to date: 1 year Requesting obesity pharmacotherapy: yes Current weight loss supplements/medications: no Previous weight loss supplements/meds: no Calories:  Patient has stopped drinking sodas.  Working on trying to go to the gym and change his diet.    HYPERTENSION without Chronic Kidney Disease Hypertension status: controlled  Satisfied with current treatment? yes Duration of hypertension: years BP monitoring frequency:  not checking BP range:  BP medication side effects:  no Medication compliance: excellent compliance Previous BP meds:valsartan Aspirin: no Recurrent headaches: no Visual changes: no Palpitations: no Dyspnea: no Chest pain: no Lower extremity edema: no Dizzy/lightheaded: no  MOOD Patient states he is out of his fluoxetine and abilify.  He hasn't been able to get refills.  Otherwise, he is doing well.     Relevant past medical, surgical, family and social history reviewed and updated as indicated. Interim medical history since our last visit reviewed. Allergies and medications reviewed and updated.  Review of Systems  Constitutional:  Positive for unexpected weight change.  Eyes:  Negative for visual disturbance.  Respiratory:  Negative for shortness of breath.   Cardiovascular:  Negative for chest pain and leg swelling.  Neurological:  Negative for light-headedness and headaches.    Per HPI unless specifically  indicated above     Objective:    BP (!) 150/81 (BP Location: Left Arm, Cuff Size: Large)   Pulse 92   Temp 98.2 F (36.8 C) (Oral)   Wt (!) 425 lb 3.2 oz (192.9 kg)   SpO2 95%   BMI 53.15 kg/m   Wt Readings from Last 3 Encounters:  06/17/22 (!) 425 lb 3.2 oz (192.9 kg)  05/27/22 (!) 420 lb (190.5 kg)  04/21/22 (!) 416 lb 3.2 oz (188.8 kg)    Physical Exam Vitals and nursing note reviewed.  Constitutional:      General: He is not in acute distress.    Appearance: Normal appearance. He is obese. He is not ill-appearing, toxic-appearing or diaphoretic.  HENT:     Head: Normocephalic.     Right Ear: External ear normal.     Left Ear: External ear normal.     Nose: Nose normal. No congestion or rhinorrhea.     Mouth/Throat:     Mouth: Mucous membranes are moist.  Eyes:     General:        Right eye: No discharge.        Left eye: No discharge.     Extraocular Movements: Extraocular movements intact.     Conjunctiva/sclera: Conjunctivae normal.     Pupils: Pupils are equal, round, and reactive to light.  Cardiovascular:     Rate and Rhythm: Normal rate and regular rhythm.     Heart sounds: No murmur heard. Pulmonary:     Effort: Pulmonary effort is normal. No respiratory distress.  Breath sounds: Normal breath sounds. No wheezing, rhonchi or rales.  Abdominal:     General: Abdomen is flat. Bowel sounds are normal. There is no distension.     Tenderness: There is no abdominal tenderness. There is no right CVA tenderness, left CVA tenderness or guarding.  Musculoskeletal:     Cervical back: Normal range of motion and neck supple.  Skin:    General: Skin is warm and dry.     Capillary Refill: Capillary refill takes less than 2 seconds.  Neurological:     General: No focal deficit present.     Mental Status: He is alert and oriented to person, place, and time.  Psychiatric:        Mood and Affect: Mood normal.        Behavior: Behavior normal.        Thought  Content: Thought content normal.        Judgment: Judgment normal.     Results for orders placed or performed in visit on 04/21/22  HgB A1c  Result Value Ref Range   Hgb A1c MFr Bld 6.1 (H) 4.8 - 5.6 %   Est. average glucose Bld gHb Est-mCnc 128 mg/dL      Assessment & Plan:   Problem List Items Addressed This Visit       Cardiovascular and Mediastinum   Primary hypertension    Chronic.  Endorses taking medication but didn't take it today.  Recommend being consistent with blood pressure medication.  Recommend checking blood pressure at home.  CMP checked at visit today.  Follow up in 3 months.  Call sooner if concerns arise.       Relevant Medications   valsartan (DIOVAN) 80 MG tablet     Other   Obesity    Recommended eating smaller high protein, low fat meals more frequently and exercising 30 mins a day 5 times a week with a goal of 10-15lb weight loss in the next 3 months.       MDD (major depressive disorder), recurrent severe, without psychosis ([place]) - Primary    Chronic.  Controlled.  Continue with current medication regimen.  Refills sent today.  Labs ordered today.  Return to clinic in 3 months for reevaluation.  Call sooner if concerns arise.        Relevant Medications   FLUoxetine (PROZAC) 40 MG capsule   Other Relevant Orders   Comp Met (CMET)     Follow up plan: Return in about 3 months (around 09/17/2022) for HTN, HLD, DM2 FU.

## 2022-06-18 LAB — COMPREHENSIVE METABOLIC PANEL
ALT: 80 IU/L — ABNORMAL HIGH (ref 0–44)
AST: 43 IU/L — ABNORMAL HIGH (ref 0–40)
Albumin/Globulin Ratio: 1.6 (ref 1.2–2.2)
Albumin: 4.4 g/dL (ref 4.1–5.1)
Alkaline Phosphatase: 90 IU/L (ref 44–121)
BUN/Creatinine Ratio: 13 (ref 9–20)
BUN: 12 mg/dL (ref 6–20)
Bilirubin Total: 0.3 mg/dL (ref 0.0–1.2)
CO2: 21 mmol/L (ref 20–29)
Calcium: 9.7 mg/dL (ref 8.7–10.2)
Chloride: 105 mmol/L (ref 96–106)
Creatinine, Ser: 0.91 mg/dL (ref 0.76–1.27)
Globulin, Total: 2.8 g/dL (ref 1.5–4.5)
Glucose: 136 mg/dL — ABNORMAL HIGH (ref 70–99)
Potassium: 4.5 mmol/L (ref 3.5–5.2)
Sodium: 143 mmol/L (ref 134–144)
Total Protein: 7.2 g/dL (ref 6.0–8.5)
eGFR: 116 mL/min/{1.73_m2} (ref >59)

## 2022-06-18 NOTE — Progress Notes (Signed)
Hi William Reid.  Your lab work looks good.  Your liver function is elevated likely due to fatty liver.  Your Glucose is elevated.  I recommend following a low fat/low carb diet.  Follow up as discussed.

## 2022-06-24 ENCOUNTER — Telehealth: Payer: Self-pay

## 2022-06-24 NOTE — Telephone Encounter (Signed)
Incoming fax from [facility] stating they have tried to reach the patient several times to schedule sleep study with no response from the patient. No further attempts will be made.

## 2022-08-03 ENCOUNTER — Encounter (HOSPITAL_COMMUNITY): Payer: Self-pay

## 2022-08-03 ENCOUNTER — Ambulatory Visit: Payer: BC Managed Care – PPO | Admitting: Gastroenterology

## 2022-08-03 ENCOUNTER — Other Ambulatory Visit: Payer: Self-pay | Admitting: Nurse Practitioner

## 2022-08-03 NOTE — Telephone Encounter (Signed)
Medication Refill - Medication: busPIRone (BUSPAR) 10 MG tablet   Has the patient contacted their pharmacy? No. (Agent: If no, request that the patient contact the pharmacy for the refill. If patient does not wish to contact the pharmacy document the reason why and proceed with request.) (Agent: If yes, when and what did the pharmacy advise?)  Preferred Pharmacy (with phone number or street name):  [id number] - [place], [place] [address] Phone: [phone]  Fax: [phone]     Has the patient been seen for an appointment in the last year OR does the patient have an upcoming appointment? Yes.    Agent: Please be advised that RX refills may take up to 3 business days. We ask that you follow-up with your pharmacy.

## 2022-08-04 MED ORDER — BUSPIRONE HCL 10 MG PO TABS: 10.0000 mg | ORAL_TABLET | Freq: Two times a day (BID) | ORAL | 0 refills | Status: DC

## 2022-08-04 NOTE — Telephone Encounter (Signed)
Requested medication (s) are due for refill today: yes  Requested medication (s) are on the active medication list: yes  Last refill:  11/04/21 #60  Future visit scheduled: yes  Notes to clinic:  please review. This is first refill since 11/04/21   Requested Prescriptions  Pending Prescriptions Disp Refills   busPIRone (BUSPAR) 10 MG tablet 60 tablet 0    Sig: Take 1 tablet (10 mg total) by mouth 2 (two) times daily.     Psychiatry: Anxiolytics/Hypnotics - Non-controlled Passed - 08/03/2022  4:20 PM      Passed - Valid encounter within last 12 months    Recent Outpatient Visits           1 month ago MDD (major depressive disorder), recurrent severe, without psychosis ([facility])   [facility] Larae Grooms, NP   3 months ago Primary hypertension   [facility] Larae Grooms, NP   5 months ago MDD (major depressive disorder), recurrent severe, without psychosis (HCC)   [facility] Larae Grooms, NP   8 months ago Fluid level behind tympanic membrane of both ears   [facility] Larae Grooms, NP   10 months ago MDD (major depressive disorder), recurrent severe, without psychosis [facility])   [facility] Larae Grooms, NP       Future Appointments             In 1 month Larae Grooms, NP [facility]   In 3 months Richardo Hanks, Laurette Schimke, MD [facility]

## 2022-08-27 ENCOUNTER — Encounter: Payer: Self-pay | Admitting: Nurse Practitioner

## 2022-08-27 ENCOUNTER — Ambulatory Visit (INDEPENDENT_AMBULATORY_CARE_PROVIDER_SITE_OTHER): Payer: BC Managed Care – PPO | Admitting: Nurse Practitioner

## 2022-08-27 VITALS — BP 137/85 | HR 79 | Temp 98.3°F | Wt >= 6400 oz

## 2022-08-27 DIAGNOSIS — R051 Acute cough: Secondary | ICD-10-CM | POA: Diagnosis not present

## 2022-08-27 DIAGNOSIS — J069 Acute upper respiratory infection, unspecified: Secondary | ICD-10-CM | POA: Diagnosis not present

## 2022-08-27 LAB — VERITOR FLU A/B WAIVED
Influenza A: NEGATIVE
Influenza B: NEGATIVE

## 2022-08-27 NOTE — Progress Notes (Signed)
BP 137/85   Pulse 79   Temp 98.3 F (36.8 C) (Oral)   Wt (!) 406 lb 9.6 oz (184.4 kg)   SpO2 97%   BMI 50.82 kg/m    Subjective:    Patient ID: William Reid, male    DOB: 03/06/1991, 32 y.o.   MRN: [id number]  HPI: William Reid is a 32 y.o. male  Chief Complaint  Patient presents with   Cold Exposure    Pt states that he has been sick since Sunday states that his dad was sick believes to have got it from him   UPPER RESPIRATORY TRACT INFECTION Worst symptom: symptoms started Sunday Fever: no Cough: yes Shortness of breath: yes Wheezing: yes Chest pain: yes, with cough Chest tightness: no Chest congestion: yes Nasal congestion: yes Runny nose: yes Post nasal drip: yes Sneezing: no Sore throat: yes Swollen glands: no Sinus pressure: no Headache: no Face pain: no Toothache: no Ear pain: no bilateral Ear pressure: no bilateral Eyes red/itching:yes  Eye drainage/crusting: no  Vomiting: no Rash: no Fatigue: yes Sick contacts: no Strep contacts: no  Context: better Recurrent sinusitis: no Relief with OTC cold/cough medications: no  Treatments attempted: none   Relevant past medical, surgical, family and social history reviewed and updated as indicated. Interim medical history since our last visit reviewed. Allergies and medications reviewed and updated.  Review of Systems  Constitutional:  Positive for fatigue. Negative for fever.  HENT:  Positive for congestion, postnasal drip, rhinorrhea and sore throat. Negative for ear pain, sinus pressure, sinus pain and sneezing.   Respiratory:  Positive for cough, shortness of breath and wheezing. Negative for chest tightness.   Gastrointestinal:  Negative for vomiting.  Skin:  Negative for rash.  Neurological:  Negative for headaches.    Per HPI unless specifically indicated above     Objective:    BP 137/85   Pulse 79   Temp 98.3 F (36.8 C) (Oral)   Wt (!) 406 lb 9.6 oz (184.4 kg)   SpO2 97%   BMI  50.82 kg/m   Wt Readings from Last 3 Encounters:  08/27/22 (!) 406 lb 9.6 oz (184.4 kg)  06/17/22 (!) 425 lb 3.2 oz (192.9 kg)  05/27/22 (!) 420 lb (190.5 kg)    Physical Exam Vitals and nursing note reviewed.  Constitutional:      General: He is not in acute distress.    Appearance: Normal appearance. He is not ill-appearing, toxic-appearing or diaphoretic.  HENT:     Head: Normocephalic.     Right Ear: Tympanic membrane and external ear normal.     Left Ear: Tympanic membrane and external ear normal.     Nose: Congestion and rhinorrhea present.     Mouth/Throat:     Mouth: Mucous membranes are moist.     Pharynx: Posterior oropharyngeal erythema present. No oropharyngeal exudate.  Eyes:     General:        Right eye: No discharge.        Left eye: No discharge.     Extraocular Movements: Extraocular movements intact.     Conjunctiva/sclera: Conjunctivae normal.     Pupils: Pupils are equal, round, and reactive to light.  Cardiovascular:     Rate and Rhythm: Normal rate and regular rhythm.     Heart sounds: No murmur heard. Pulmonary:     Effort: Pulmonary effort is normal. No respiratory distress.     Breath sounds: Normal breath sounds. No wheezing, rhonchi  or rales.  Abdominal:     General: Abdomen is flat. Bowel sounds are normal.  Musculoskeletal:     Cervical back: Normal range of motion and neck supple.  Skin:    General: Skin is warm and dry.     Capillary Refill: Capillary refill takes less than 2 seconds.  Neurological:     General: No focal deficit present.     Mental Status: He is alert and oriented to person, place, and time.  Psychiatric:        Mood and Affect: Mood normal.        Behavior: Behavior normal.        Thought Content: Thought content normal.        Judgment: Judgment normal.     Results for orders placed or performed in visit on 06/17/22  Comp Met (CMET)  Result Value Ref Range   Glucose 136 (H) 70 - 99 mg/dL   BUN 12 6 - 20 mg/dL    Creatinine, Ser 1.30 0.76 - 1.27 mg/dL   eGFR 865 >78 IO/NGE/9.52   BUN/Creatinine Ratio 13 9 - 20   Sodium 143 134 - 144 mmol/L   Potassium 4.5 3.5 - 5.2 mmol/L   Chloride 105 96 - 106 mmol/L   CO2 21 20 - 29 mmol/L   Calcium 9.7 8.7 - 10.2 mg/dL   Total Protein 7.2 6.0 - 8.5 g/dL   Albumin 4.4 4.1 - 5.1 g/dL   Globulin, Total 2.8 1.5 - 4.5 g/dL   Albumin/Globulin Ratio 1.6 1.2 - 2.2   Bilirubin Total 0.3 0.0 - 1.2 mg/dL   Alkaline Phosphatase 90 44 - 121 IU/L   AST 43 (H) 0 - 40 IU/L   ALT 80 (H) 0 - 44 IU/L      Assessment & Plan:   Problem List Items Addressed This Visit   None Visit Diagnoses     Viral upper respiratory infection    -  Primary   Flu neg in office. COVID sent out. Symptoms likely viral in nature. Recommend rest, hydration and OTC symptom management.   Acute cough       Relevant Orders   Veritor Flu A/B Waived   Novel Coronavirus, NAA ([facility])        Follow up plan: No follow-ups on file.

## 2022-08-28 NOTE — Progress Notes (Signed)
Results discussed with patient during visit.

## 2022-08-29 LAB — NOVEL CORONAVIRUS, NAA: SARS-CoV-2, NAA: NOT DETECTED

## 2022-08-31 NOTE — Progress Notes (Signed)
Hi William Reid. Your COVID test was negative.

## 2022-09-16 NOTE — Progress Notes (Unsigned)
There were no vitals taken for this visit.   Subjective:    Patient ID: THORTON CSIZMADIA, male    DOB: Sep 01, 1990, 32 y.o.   MRN: [id number]  HPI: LENY ONDER is a 32 y.o. male  No chief complaint on file.  WEIGHT GAIN Duration: years Previous attempts at weight loss: no Complications of obesity: htn Peak weight: 425 lb Weight loss goal: 350lb Weight loss to date: 1 year Requesting obesity pharmacotherapy: yes Current weight loss supplements/medications: no Previous weight loss supplements/meds: no Calories:  Patient has stopped drinking sodas.  Working on trying to go to the gym and change his diet.    HYPERTENSION without Chronic Kidney Disease Hypertension status: controlled  Satisfied with current treatment? yes Duration of hypertension: years BP monitoring frequency:  not checking BP range:  BP medication side effects:  no Medication compliance: excellent compliance Previous BP meds:valsartan Aspirin: no Recurrent headaches: no Visual changes: no Palpitations: no Dyspnea: no Chest pain: no Lower extremity edema: no Dizzy/lightheaded: no  MOOD Patient states he is out of his fluoxetine and abilify.  He hasn't been able to get refills.  Otherwise, he is doing well.     Relevant past medical, surgical, family and social history reviewed and updated as indicated. Interim medical history since our last visit reviewed. Allergies and medications reviewed and updated.  Review of Systems  Constitutional:  Positive for unexpected weight change.  Eyes:  Negative for visual disturbance.  Respiratory:  Negative for shortness of breath.   Cardiovascular:  Negative for chest pain and leg swelling.  Neurological:  Negative for light-headedness and headaches.    Per HPI unless specifically indicated above     Objective:    There were no vitals taken for this visit.  Wt Readings from Last 3 Encounters:  08/27/22 (!) 406 lb 9.6 oz (184.4 kg)  06/17/22 (!) 425 lb  3.2 oz (192.9 kg)  05/27/22 (!) 420 lb (190.5 kg)    Physical Exam Vitals and nursing note reviewed.  Constitutional:      General: He is not in acute distress.    Appearance: Normal appearance. He is obese. He is not ill-appearing, toxic-appearing or diaphoretic.  HENT:     Head: Normocephalic.     Right Ear: External ear normal.     Left Ear: External ear normal.     Nose: Nose normal. No congestion or rhinorrhea.     Mouth/Throat:     Mouth: Mucous membranes are moist.  Eyes:     General:        Right eye: No discharge.        Left eye: No discharge.     Extraocular Movements: Extraocular movements intact.     Conjunctiva/sclera: Conjunctivae normal.     Pupils: Pupils are equal, round, and reactive to light.  Cardiovascular:     Rate and Rhythm: Normal rate and regular rhythm.     Heart sounds: No murmur heard. Pulmonary:     Effort: Pulmonary effort is normal. No respiratory distress.     Breath sounds: Normal breath sounds. No wheezing, rhonchi or rales.  Abdominal:     General: Abdomen is flat. Bowel sounds are normal. There is no distension.     Tenderness: There is no abdominal tenderness. There is no right CVA tenderness, left CVA tenderness or guarding.  Musculoskeletal:     Cervical back: Normal range of motion and neck supple.  Skin:    General: Skin is warm and dry.  Capillary Refill: Capillary refill takes less than 2 seconds.  Neurological:     General: No focal deficit present.     Mental Status: He is alert and oriented to person, place, and time.  Psychiatric:        Mood and Affect: Mood normal.        Behavior: Behavior normal.        Thought Content: Thought content normal.        Judgment: Judgment normal.     Results for orders placed or performed in visit on 08/27/22  Novel Coronavirus, NAA ([facility])   Specimen: Nasopharyngeal(NP) swabs in vial transport medium  Result Value Ref Range   SARS-CoV-2, NAA Not Detected Not Detected  Veritor  Flu A/B Waived  Result Value Ref Range   Influenza A Negative Negative   Influenza B Negative Negative      Assessment & Plan:   Problem List Items Addressed This Visit   None    Follow up plan: No follow-ups on file.

## 2022-09-17 ENCOUNTER — Encounter: Payer: Self-pay | Admitting: Nurse Practitioner

## 2022-09-17 ENCOUNTER — Ambulatory Visit: Payer: BC Managed Care – PPO | Admitting: Nurse Practitioner

## 2022-09-17 VITALS — BP 136/84 | HR 77 | Temp 97.4°F | Wt >= 6400 oz

## 2022-09-17 DIAGNOSIS — I10 Essential (primary) hypertension: Secondary | ICD-10-CM | POA: Diagnosis not present

## 2022-09-17 DIAGNOSIS — F332 Major depressive disorder, recurrent severe without psychotic features: Secondary | ICD-10-CM

## 2022-09-17 DIAGNOSIS — R7303 Prediabetes: Secondary | ICD-10-CM

## 2022-09-17 MED ORDER — ARIPIPRAZOLE 5 MG PO TABS: 5.0000 mg | ORAL_TABLET | Freq: Every day | ORAL | 1 refills | Status: DC

## 2022-09-17 MED ORDER — FLUOXETINE HCL 40 MG PO CAPS: 40.0000 mg | ORAL_CAPSULE | Freq: Every day | ORAL | 1 refills | Status: DC

## 2022-09-17 MED ORDER — BUSPIRONE HCL 10 MG PO TABS: 10.0000 mg | ORAL_TABLET | Freq: Two times a day (BID) | ORAL | 1 refills | Status: DC

## 2022-09-17 MED ORDER — VALSARTAN 80 MG PO TABS: 80.0000 mg | ORAL_TABLET | Freq: Every day | ORAL | 1 refills | Status: DC

## 2022-09-17 NOTE — Assessment & Plan Note (Signed)
Chronic.  Endorses taking medication but didn't take it today.  Recommend checking blood pressure at home.  Labs ordered at visit today. Refills sent today.  Follow up in 6 months.  Call sooner if concerns arise.

## 2022-09-17 NOTE — Assessment & Plan Note (Signed)
Chronic.  Controlled.  Continue with current medication regimen of Abilify, Prozac and Buspar.  Refills sent today.  Labs ordered today.  Return to clinic in 6 months for reevaluation.  Call sooner if concerns arise.

## 2022-09-17 NOTE — Assessment & Plan Note (Signed)
Labs ordered at visit today.  Will make recommendations based on lab results.   

## 2022-09-18 LAB — COMPREHENSIVE METABOLIC PANEL
ALT: 78 IU/L — ABNORMAL HIGH (ref 0–44)
AST: 40 IU/L (ref 0–40)
Albumin: 4.5 g/dL (ref 4.1–5.1)
Alkaline Phosphatase: 95 IU/L (ref 44–121)
BUN/Creatinine Ratio: 13 (ref 9–20)
BUN: 11 mg/dL (ref 6–20)
Bilirubin Total: 0.4 mg/dL (ref 0.0–1.2)
CO2: 22 mmol/L (ref 20–29)
Calcium: 9.5 mg/dL (ref 8.7–10.2)
Chloride: 102 mmol/L (ref 96–106)
Creatinine, Ser: 0.83 mg/dL (ref 0.76–1.27)
Globulin, Total: 2.8 g/dL (ref 1.5–4.5)
Glucose: 120 mg/dL — ABNORMAL HIGH (ref 70–99)
Potassium: 4.5 mmol/L (ref 3.5–5.2)
Sodium: 139 mmol/L (ref 134–144)
Total Protein: 7.3 g/dL (ref 6.0–8.5)
eGFR: 119 mL/min/{1.73_m2} (ref >59)

## 2022-09-18 LAB — HEMOGLOBIN A1C
Est. average glucose Bld gHb Est-mCnc: 134 mg/dL
Hgb A1c MFr Bld: 6.3 % — ABNORMAL HIGH (ref 4.8–5.6)

## 2022-09-18 NOTE — Progress Notes (Signed)
Hi William Reid. It was nice to see you yesterday.  Your lab work looks good.  Your A1c is in the prediabetic range.  I recommend a low carb diet and exercise.  No concerns at this time. Continue with your current medication regimen.  Follow up as discussed.  Please let me know if you have any questions.

## 2022-11-20 ENCOUNTER — Other Ambulatory Visit: Payer: Self-pay | Admitting: *Deleted

## 2022-11-20 DIAGNOSIS — E291 Testicular hypofunction: Secondary | ICD-10-CM

## 2022-11-23 ENCOUNTER — Other Ambulatory Visit: Payer: BC Managed Care – PPO

## 2022-11-25 ENCOUNTER — Ambulatory Visit: Payer: BC Managed Care – PPO | Admitting: Urology

## 2022-11-26 ENCOUNTER — Ambulatory Visit: Payer: BC Managed Care – PPO | Admitting: Urology

## 2023-02-19 ENCOUNTER — Ambulatory Visit: Payer: BC Managed Care – PPO | Admitting: Family Medicine

## 2023-02-19 VITALS — BP 149/86 | HR 79 | Temp 98.0°F | Ht 76.18 in | Wt >= 6400 oz

## 2023-02-19 DIAGNOSIS — N5089 Other specified disorders of the male genital organs: Secondary | ICD-10-CM | POA: Diagnosis not present

## 2023-02-19 NOTE — Assessment & Plan Note (Signed)
Acute, stable. 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 Physical exam reveals not likely flare. Recommend warm compress and using vaseline to serve as barrier to prevent irritation when skin rubs against it. Educated to follow up if worsening.

## 2023-02-19 NOTE — Patient Instructions (Signed)
Treat with warm compress to the area, can try vaseline to area to serve as barrier

## 2023-02-19 NOTE — Progress Notes (Signed)
BP (!) 149/86   Pulse 79   Temp 98 F (36.7 C) (Oral)   Ht 6' 4.18" (1.935 m)   Wt (!) 412 lb (186.9 kg)   SpO2 97%   BMI 49.91 kg/m    Subjective:    Patient ID: William Reid, male    DOB: 1990/12/22, 32 y.o.   MRN: [id number]  HPI: William Reid is a 32 y.o. male  Chief Complaint  Patient presents with   genital sore    Complains of lesion between buttock and scrotum that he noticed 1 week ago. He denies pain or tenderness in the area.  Sexual activity:  Not sexually active Contraception: no Recent unprotected intercourse: no History of sexually transmitted diseases: yes Previous sexually transmitted disease screening: no Lifetime sexual partners:  Genital lesions: yes Penile discharge: no Dysuria: no Fevers: no Rash: no   Relevant past medical, surgical, family and social history reviewed and updated as indicated. Interim medical history since our last visit reviewed. Allergies and medications reviewed and updated.  Review of Systems  Constitutional:  Negative for fever.  Respiratory: Negative.    Cardiovascular: Negative.   Genitourinary:  Positive for genital sores. Negative for dysuria, penile discharge, penile pain, penile swelling, scrotal swelling and testicular pain.    Per HPI unless specifically indicated above     Objective:    BP (!) 149/86   Pulse 79   Temp 98 F (36.7 C) (Oral)   Ht 6' 4.18" (1.935 m)   Wt (!) 412 lb (186.9 kg)   SpO2 97%   BMI 49.91 kg/m   Wt Readings from Last 3 Encounters:  02/19/23 (!) 412 lb (186.9 kg)  09/17/22 (!) 404 lb 9.6 oz (183.5 kg)  08/27/22 (!) 406 lb 9.6 oz (184.4 kg)    Physical Exam Vitals and nursing note reviewed.  Constitutional:      General: He is awake. He is not in acute distress.    Appearance: Normal appearance. He is well-developed and well-groomed. He is morbidly obese. He is not ill-appearing.  HENT:     Head: Normocephalic and atraumatic.     Right Ear: Hearing and external ear  normal. No drainage.     Left Ear: Hearing and external ear normal. No drainage.     Nose: Nose normal.  Eyes:     General: Lids are normal.        Right eye: No discharge.        Left eye: No discharge.     Conjunctiva/sclera: Conjunctivae normal.  Cardiovascular:     Rate and Rhythm: Normal rate and regular rhythm.     Pulses:          Radial pulses are 2+ on the right side and 2+ on the left side.     Heart sounds: Normal heart sounds, S1 normal and S2 normal. No murmur heard.    No gallop.  Pulmonary:     Effort: Pulmonary effort is normal. No accessory muscle usage or respiratory distress.     Breath sounds: Normal breath sounds. No wheezing, rhonchi or rales.  Musculoskeletal:        General: Normal range of motion.     Cervical back: Full passive range of motion without pain and normal range of motion.     Right lower leg: No edema.     Left lower leg: No edema.  Skin:    General: Skin is warm and dry.     Capillary Refill:  Capillary refill takes less than 2 seconds.  Neurological:     Mental Status: He is alert and oriented to person, place, and time.  Psychiatric:        Attention and Perception: Attention normal.        Mood and Affect: Mood normal.        Speech: Speech normal.        Behavior: Behavior normal. Behavior is cooperative.        Thought Content: Thought content normal.     Results for orders placed or performed in visit on 09/17/22  Comp Met (CMET)  Result Value Ref Range   Glucose 120 (H) 70 - 99 mg/dL   BUN 11 6 - 20 mg/dL   Creatinine, Ser 1.61 0.76 - 1.27 mg/dL   eGFR 096 >04 VW/UJW/1.19   BUN/Creatinine Ratio 13 9 - 20   Sodium 139 134 - 144 mmol/L   Potassium 4.5 3.5 - 5.2 mmol/L   Chloride 102 96 - 106 mmol/L   CO2 22 20 - 29 mmol/L   Calcium 9.5 8.7 - 10.2 mg/dL   Total Protein 7.3 6.0 - 8.5 g/dL   Albumin 4.5 4.1 - 5.1 g/dL   Globulin, Total 2.8 1.5 - 4.5 g/dL   Bilirubin Total 0.4 0.0 - 1.2 mg/dL   Alkaline Phosphatase 95 44 -  121 IU/L   AST 40 0 - 40 IU/L   ALT 78 (H) 0 - 44 IU/L  HgB A1c  Result Value Ref Range   Hgb A1c MFr Bld 6.3 (H) 4.8 - 5.6 %   Est. average glucose Bld gHb Est-mCnc 134 mg/dL      Assessment & Plan:   Problem List Items Addressed This Visit     Genital lesion, male - Primary    Acute, stable. 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 Physical exam reveals not likely flare. Recommend warm compress and using vaseline to serve as barrier to prevent irritation when skin rubs against it. Educated to follow up if worsening.       Relevant Orders   HSV 1 and 2 Ab, IgG     Follow up plan: Return if symptoms worsen or fail to improve.

## 2023-02-20 LAB — HSV 1 AND 2 AB, IGG
HSV 1 Glycoprotein G Ab, IgG: NONREACTIVE
HSV 2 IgG, Type Spec: NONREACTIVE

## 2023-02-23 NOTE — Progress Notes (Signed)
Hi William Reid, your HSV results have returned non reactive. Continue with plan as discussed at office visit. Thank you for allowing me to participate in your care.

## 2023-03-22 ENCOUNTER — Encounter: Payer: BC Managed Care – PPO | Admitting: Nurse Practitioner

## 2023-03-22 NOTE — Progress Notes (Deleted)
There were no vitals taken for this visit.   Subjective:    Patient ID: William Reid, male    DOB: 1991-01-18, 32 y.o.   MRN: [id number]  HPI: William Reid is a 32 y.o. male presenting on 03/22/2023 for comprehensive medical examination. Current medical complaints include:{Blank single:19197::"none","***"}  He currently lives with: Interim Problems from his last visit: {Blank single:19197::"yes","no"}  HYPERTENSION {Blank single:19197::"without","with"} Chronic Kidney Disease Hypertension status: {Blank single:19197::"controlled","uncontrolled","better","worse","exacerbated","stable"}  Satisfied with current treatment? {Blank single:19197::"yes","no"} Duration of hypertension: {Blank single:19197::"chronic","months","years"} BP monitoring frequency:  {Blank single:19197::"not checking","rarely","daily","weekly","monthly","a few times a day","a few times a week","a few times a month"} BP range:  BP medication side effects:  {Blank single:19197::"yes","no"} Medication compliance: {Blank single:19197::"excellent compliance","good compliance","fair compliance","poor compliance"} Previous BP meds:{Blank multiple:19196::"none","amlodipine","amlodipine/benazepril","atenolol","benazepril","benazepril/HCTZ","bisoprolol (bystolic)","carvedilol","chlorthalidone","clonidine","diltiazem","exforge HCT","HCTZ","irbesartan (avapro)","labetalol","lisinopril","lisinopril-HCTZ","losartan (cozaar)","methyldopa","nifedipine","olmesartan (benicar)","olmesartan-HCTZ","quinapril","ramipril","spironalactone","tekturna","valsartan","valsartan-HCTZ","verapamil"} Aspirin: {Blank single:19197::"yes","no"} Recurrent headaches: {Blank single:19197::"yes","no"} Visual changes: {Blank single:19197::"yes","no"} Palpitations: {Blank single:19197::"yes","no"} Dyspnea: {Blank single:19197::"yes","no"} Chest pain: {Blank single:19197::"yes","no"} Lower extremity edema: {Blank  single:19197::"yes","no"} Dizzy/lightheaded: {Blank single:19197::"yes","no"}  MOOD   Depression Screen done today and results listed below:     09/17/2022   10:22 AM 08/27/2022    2:02 PM 06/17/2022    3:10 PM 04/21/2022    3:22 PM 02/17/2022    9:17 AM  Depression screen PHQ 2/9  Decreased Interest 1 1 0 0 0  Down, Depressed, Hopeless 0 0 0 0 0  PHQ - 2 Score 1 1 0 0 0  Altered sleeping 2 0 1 1 1   Tired, decreased energy 1 0 1 1 3   Change in appetite 1 0 1 1 3   Feeling bad or failure about yourself  1 0 1 0 1  Trouble concentrating 1 1 0 0 1  Moving slowly or fidgety/restless 0 0 0 0 0  Suicidal thoughts 0 0 0 0 0  PHQ-9 Score 7 2 4 3 9   Difficult doing work/chores  Not difficult at all Somewhat difficult Not difficult at all Somewhat difficult    The patient {has/does not have:19849} a history of falls. I {did/did not:19850} complete a risk assessment for falls. A plan of care for falls {was/was not:19852} documented.   Past Medical History:  Past Medical History:  Diagnosis Date   Allergy    Seizures (HCC)     Surgical History:  No past surgical history on file.  Medications:  Current Outpatient Medications on File Prior to Visit  Medication Sig   ARIPiprazole (ABILIFY) 5 MG tablet Take 1 tablet (5 mg total) by mouth daily.   busPIRone (BUSPAR) 10 MG tablet Take 1 tablet (10 mg total) by mouth 2 (two) times daily.   FLUoxetine (PROZAC) 40 MG capsule Take 1 capsule (40 mg total) by mouth daily.   pantoprazole (PROTONIX) 40 MG tablet Take 1 tablet (40 mg total) by mouth daily.   valsartan (DIOVAN) 80 MG tablet Take 1 tablet (80 mg total) by mouth daily.   No current facility-administered medications on file prior to visit.    Allergies:  No Known Allergies  Social History:  Social History   Socioeconomic History   Marital status: Single    Spouse name: Not on file   Number of children: Not on file   Years of education: Not on file   Highest education  level: Not on file  Occupational History   Not on file  Tobacco Use   Smoking status: Former   Smokeless tobacco: Never  Vaping Use   Vaping status: Never Used  Substance and Sexual Activity   Alcohol use: Yes  Comment: occasionally   Drug use: Yes    Types: Marijuana    Comment: very rare   Sexual activity: Not Currently  Other Topics Concern   Not on file  Social History Narrative   Not on file   Social Drivers of Health   Financial Resource Strain: Not on file  Food Insecurity: Not on file  Transportation Needs: Not on file  Physical Activity: Not on file  Stress: Not on file  Social Connections: Not on file  Intimate Partner Violence: Not on file   Social History   Tobacco Use  Smoking Status Former  Smokeless Tobacco Never   Social History   Substance and Sexual Activity  Alcohol Use Yes   Comment: occasionally    Family History:  Family History  Problem Relation Age of Onset   Epilepsy Father    Hypertension Father     Past medical history, surgical history, medications, allergies, family history and social history reviewed with patient today and changes made to appropriate areas of the chart.   ROS All other ROS negative except what is listed above and in the HPI.      Objective:    There were no vitals taken for this visit.  Wt Readings from Last 3 Encounters:  02/19/23 (!) 412 lb (186.9 kg)  09/17/22 (!) 404 lb 9.6 oz (183.5 kg)  08/27/22 (!) 406 lb 9.6 oz (184.4 kg)    Physical Exam  Results for orders placed or performed in visit on 02/19/23  HSV 1 and 2 Ab, IgG   Collection Time: 02/19/23  4:30 PM  Result Value Ref Range   HSV 1 Glycoprotein G Ab, IgG Non Reactive Non Reactive   HSV 2 IgG, Type Spec Non Reactive Non Reactive      Assessment & Plan:   Problem List Items Addressed This Visit       Cardiovascular and Mediastinum   Primary hypertension - Primary     Other   Severe major depression, single episode, with  psychotic features (HCC)   Obesity   Generalized anxiety disorder   Prediabetes     Discussed aspirin prophylaxis for myocardial infarction prevention and decision was {Blank single:19197::"it was not indicated","made to continue ASA","made to start ASA","made to stop ASA","that we recommended ASA, and patient refused"}  LABORATORY TESTING:  Health maintenance labs ordered today as discussed above.   The natural history of prostate cancer and ongoing controversy regarding screening and potential treatment outcomes of prostate cancer has been discussed with the patient. The meaning of a false positive PSA and a false negative PSA has been discussed. He indicates understanding of the limitations of this screening test and wishes *** to proceed with screening PSA testing.   IMMUNIZATIONS:   - Tdap: Tetanus vaccination status reviewed: {tetanus status:315746}. - Influenza: {Blank single:19197::"Up to date","Administered today","Postponed to flu season","Refused","Given elsewhere"} - Pneumovax: {Blank single:19197::"Up to date","Administered today","Not applicable","Refused","Given elsewhere"} - Prevnar: {Blank single:19197::"Up to date","Administered today","Not applicable","Refused","Given elsewhere"} - COVID: {Blank single:19197::"Up to date","Administered today","Not applicable","Refused","Given elsewhere"} - HPV: {Blank single:19197::"Up to date","Administered today","Not applicable","Refused","Given elsewhere"} - Shingrix vaccine: {Blank single:19197::"Up to date","Administered today","Not applicable","Refused","Given elsewhere"}  SCREENING: - Colonoscopy: {Blank single:19197::"Up to date","Ordered today","Not applicable","Refused","Done elsewhere"}  Discussed with patient purpose of the colonoscopy is to detect colon cancer at curable precancerous or early stages   - AAA Screening: {Blank single:19197::"Up to date","Ordered today","Not applicable","Refused","Done elsewhere"}  -Hearing  Test: {Blank single:19197::"Up to date","Ordered today","Not applicable","Refused","Done elsewhere"}  -Spirometry: {Blank single:19197::"Up to date","Ordered today","Not applicable","Refused","Done elsewhere"}  PATIENT COUNSELING:    Sexuality: Discussed sexually transmitted diseases, partner selection, use of condoms, avoidance of unintended pregnancy  and contraceptive alternatives.   Advised to avoid cigarette smoking.  I discussed with the patient that most people either abstain from alcohol or drink within safe limits (<=14/week and <=4 drinks/occasion for males, <=7/weeks and <= 3 drinks/occasion for females) and that the risk for alcohol disorders and other health effects rises proportionally with the number of drinks per week and how often a drinker exceeds daily limits.  Discussed cessation/primary prevention of drug use and availability of treatment for abuse.   Diet: Encouraged to adjust caloric intake to maintain  or achieve ideal body weight, to reduce intake of dietary saturated fat and total fat, to limit sodium intake by avoiding high sodium foods and not adding table salt, and to maintain adequate dietary potassium and calcium preferably from fresh fruits, vegetables, and low-fat dairy products.    stressed the importance of regular exercise  Injury prevention: Discussed safety belts, safety helmets, smoke detector, smoking near bedding or upholstery.   Dental health: Discussed importance of regular tooth brushing, flossing, and dental visits.   Follow up plan: NEXT PREVENTATIVE PHYSICAL DUE IN 1 YEAR. No follow-ups on file.

## 2023-06-15 ENCOUNTER — Ambulatory Visit: Payer: Self-pay | Admitting: Nurse Practitioner

## 2023-06-15 ENCOUNTER — Encounter: Payer: Self-pay | Admitting: Nurse Practitioner

## 2023-06-15 VITALS — BP 158/107 | HR 85 | Ht 76.18 in | Wt 395.4 lb

## 2023-06-15 DIAGNOSIS — Z23 Encounter for immunization: Secondary | ICD-10-CM

## 2023-06-15 DIAGNOSIS — R350 Frequency of micturition: Secondary | ICD-10-CM | POA: Diagnosis not present

## 2023-06-15 DIAGNOSIS — E119 Type 2 diabetes mellitus without complications: Secondary | ICD-10-CM | POA: Diagnosis not present

## 2023-06-15 DIAGNOSIS — E118 Type 2 diabetes mellitus with unspecified complications: Secondary | ICD-10-CM | POA: Diagnosis not present

## 2023-06-15 DIAGNOSIS — Z7985 Type 2 diabetes mellitus without complications: Secondary | ICD-10-CM | POA: Insufficient documentation

## 2023-06-15 DIAGNOSIS — I10 Essential (primary) hypertension: Secondary | ICD-10-CM

## 2023-06-15 DIAGNOSIS — E1165 Type 2 diabetes mellitus with hyperglycemia: Secondary | ICD-10-CM | POA: Insufficient documentation

## 2023-06-15 LAB — URINALYSIS, ROUTINE W REFLEX MICROSCOPIC
Bilirubin, UA: NEGATIVE
Leukocytes,UA: NEGATIVE
Nitrite, UA: NEGATIVE
Specific Gravity, UA: >1.03 — ABNORMAL HIGH (ref 1.005–1.030)
Urobilinogen, Ur: 1 mg/dL (ref 0.2–1.0)
pH, UA: 6 (ref 5.0–7.5)

## 2023-06-15 LAB — MICROSCOPIC EXAMINATION

## 2023-06-15 LAB — BAYER DCA HB A1C WAIVED: HB A1C (BAYER DCA - WAIVED): 10.4 % — ABNORMAL HIGH (ref 4.8–5.6)

## 2023-06-15 MED ORDER — TIRZEPATIDE 5 MG/0.5ML ~~LOC~~ SOAJ: 5.0000 mg | SUBCUTANEOUS | 0 refills | Status: DC

## 2023-06-15 MED ORDER — VALSARTAN 80 MG PO TABS: 80.0000 mg | ORAL_TABLET | Freq: Every day | ORAL | 1 refills | Status: DC

## 2023-06-15 MED ORDER — TIRZEPATIDE 2.5 MG/0.5ML ~~LOC~~ SOAJ: 2.5000 mg | SUBCUTANEOUS | 0 refills | Status: DC

## 2023-06-15 MED ORDER — ROSUVASTATIN CALCIUM 5 MG PO TABS: 5.0000 mg | ORAL_TABLET | Freq: Every day | ORAL | 1 refills | Status: AC

## 2023-06-15 NOTE — Assessment & Plan Note (Signed)
 New diagnosis today.  A1c of 10.4%.  Lengthy discussion had regarding lifestyle changes, medications, and recommendations.  Will start [facility] 2.5mg  weekly.  Side effects and benefits of medication discussed during visit.  Will also start Rosuvastatin for cardiac prevention.  Prevnar 20 given today.  Referral to diabetes education placed.  Follow up in 1 month.  Call sooner if concerns arise.

## 2023-06-15 NOTE — Progress Notes (Signed)
 BP (!) 158/107 (BP Location: Right Arm, Patient Position: Sitting, Cuff Size: Large)   Pulse 85   Ht 6' 4.18" (1.935 m)   Wt (!) 395 lb 6.4 oz (179.4 kg)   SpO2 96%   BMI 47.90 kg/m    Subjective:    Patient ID: William Reid, male    DOB: 10/06/1990, 33 y.o.   MRN: [id number]  HPI: William Reid is a 33 y.o. male  Chief Complaint  Patient presents with   Urinary Frequency    Notices more when drinking sodas and sugary drinks verses when drinking water.    Medication Refill    valsartan   Patient presents to clinic with complaints of urinary frequency.   He states he does have some days when he eats poorly and drinks more sugary drinks.  States on those days he notices that he is urinating a lot more.  He has lost almost 20lbs since last visit.    HYPERTENSION without Chronic Kidney Disease Hypertension status: uncontrolled  Satisfied with current treatment? no Duration of hypertension: years BP monitoring frequency:   not checking BP range:  BP medication side effects:  no Medication compliance: excellent compliance Previous BP meds:valsartan Aspirin: no Recurrent headaches: no Visual changes: no Palpitations: no Dyspnea: no Chest pain: no Lower extremity edema: no Dizzy/lightheaded: no    Relevant past medical, surgical, family and social history reviewed and updated as indicated. Interim medical history since our last visit reviewed. Allergies and medications reviewed and updated.  Review of Systems  Constitutional:  Positive for unexpected weight change.  Eyes:  Negative for visual disturbance.  Respiratory:  Negative for chest tightness and shortness of breath.   Cardiovascular:  Negative for chest pain, palpitations and leg swelling.  Endocrine: Positive for polyuria.  Neurological:  Negative for dizziness, light-headedness and headaches.    Per HPI unless specifically indicated above     Objective:    BP (!) 158/107 (BP Location: Right Arm,  Patient Position: Sitting, Cuff Size: Large)   Pulse 85   Ht 6' 4.18" (1.935 m)   Wt (!) 395 lb 6.4 oz (179.4 kg)   SpO2 96%   BMI 47.90 kg/m   Wt Readings from Last 3 Encounters:  06/15/23 (!) 395 lb 6.4 oz (179.4 kg)  02/19/23 (!) 412 lb (186.9 kg)  09/17/22 (!) 404 lb 9.6 oz (183.5 kg)    Physical Exam Vitals and nursing note reviewed.  Constitutional:      General: He is not in acute distress.    Appearance: Normal appearance. He is obese. He is not ill-appearing, toxic-appearing or diaphoretic.  HENT:     Head: Normocephalic.     Right Ear: External ear normal.     Left Ear: External ear normal.     Nose: Nose normal. No congestion or rhinorrhea.     Mouth/Throat:     Mouth: Mucous membranes are moist.  Eyes:     General:        Right eye: No discharge.        Left eye: No discharge.     Extraocular Movements: Extraocular movements intact.     Conjunctiva/sclera: Conjunctivae normal.     Pupils: Pupils are equal, round, and reactive to light.  Cardiovascular:     Rate and Rhythm: Normal rate and regular rhythm.     Heart sounds: No murmur heard. Pulmonary:     Effort: Pulmonary effort is normal. No respiratory distress.     Breath  sounds: Normal breath sounds. No wheezing, rhonchi or rales.  Abdominal:     General: Abdomen is flat. Bowel sounds are normal.  Musculoskeletal:     Cervical back: Normal range of motion and neck supple.  Skin:    General: Skin is warm and dry.     Capillary Refill: Capillary refill takes less than 2 seconds.  Neurological:     General: No focal deficit present.     Mental Status: He is alert and oriented to person, place, and time.  Psychiatric:        Mood and Affect: Mood normal.        Behavior: Behavior normal.        Thought Content: Thought content normal.        Judgment: Judgment normal.     Results for orders placed or performed in visit on 02/19/23  HSV 1 and 2 Ab, IgG   Collection Time: 02/19/23  4:30 PM  Result  Value Ref Range   HSV 1 Glycoprotein G Ab, IgG Non Reactive Non Reactive   HSV 2 IgG, Type Spec Non Reactive Non Reactive      Assessment & Plan:   Problem List Items Addressed This Visit   None    Follow up plan: No follow-ups on file.

## 2023-06-15 NOTE — Assessment & Plan Note (Signed)
 Chronic.  Not well controlled.  Patient has not been taking his medication.  Needs refill.  Follow up in 1 month.  Call sooner if concerns arise.

## 2023-06-16 ENCOUNTER — Encounter: Payer: Self-pay | Admitting: Nurse Practitioner

## 2023-06-16 LAB — COMPREHENSIVE METABOLIC PANEL
ALT: 91 IU/L — ABNORMAL HIGH (ref 0–44)
AST: 59 IU/L — ABNORMAL HIGH (ref 0–40)
Albumin: 4.5 g/dL (ref 4.1–5.1)
Alkaline Phosphatase: 110 IU/L (ref 44–121)
BUN/Creatinine Ratio: 13 (ref 9–20)
BUN: 11 mg/dL (ref 6–20)
Bilirubin Total: 0.5 mg/dL (ref 0.0–1.2)
CO2: 23 mmol/L (ref 20–29)
Calcium: 9.4 mg/dL (ref 8.7–10.2)
Chloride: 98 mmol/L (ref 96–106)
Creatinine, Ser: 0.84 mg/dL (ref 0.76–1.27)
Globulin, Total: 2.9 g/dL (ref 1.5–4.5)
Glucose: 307 mg/dL — ABNORMAL HIGH (ref 70–99)
Potassium: 4.3 mmol/L (ref 3.5–5.2)
Sodium: 137 mmol/L (ref 134–144)
Total Protein: 7.4 g/dL (ref 6.0–8.5)
eGFR: 119 mL/min/{1.73_m2} (ref >59)

## 2023-06-17 ENCOUNTER — Telehealth: Payer: Self-pay

## 2023-06-17 NOTE — Telephone Encounter (Signed)
 Copied from CRM [id number]. Topic: Clinical - Medication Question >> Jun 17, 2023  2:23 PM Almira Coaster wrote: Reason for CRM: Patient is calling because the pharmacy advised patient that the tirzepatide [facility]) 2.5 MG/0.5ML Pen prescription does require a prior auth. Patient would like to know if the prior Berkley Harvey has been started.

## 2023-06-17 NOTE — Telephone Encounter (Signed)
 PA for [facility] initiated and submitted via Cover My Meds. Key: Arta Bruce

## 2023-06-18 ENCOUNTER — Ambulatory Visit: Payer: Self-pay | Admitting: *Deleted

## 2023-06-18 ENCOUNTER — Ambulatory Visit: Admitting: Family Medicine

## 2023-06-18 ENCOUNTER — Encounter: Payer: Self-pay | Admitting: Family Medicine

## 2023-06-18 VITALS — BP 144/90 | HR 87 | Ht 76.18 in | Wt >= 6400 oz

## 2023-06-18 DIAGNOSIS — A6001 Herpesviral infection of penis: Secondary | ICD-10-CM

## 2023-06-18 DIAGNOSIS — B3742 Candidal balanitis: Secondary | ICD-10-CM

## 2023-06-18 MED ORDER — MUPIROCIN 2 % EX OINT: 1.0000 | TOPICAL_OINTMENT | Freq: Two times a day (BID) | CUTANEOUS | 0 refills | Status: DC

## 2023-06-18 MED ORDER — VALACYCLOVIR HCL 500 MG PO TABS
500.0000 mg | ORAL_TABLET | Freq: Every day | ORAL | 0 refills | Status: DC
Start: 2023-06-18 — End: 2023-12-16

## 2023-06-18 MED ORDER — FLUCONAZOLE 150 MG PO TABS: ORAL_TABLET | ORAL | 0 refills | Status: DC

## 2023-06-18 MED ORDER — HYDROCORTISONE 1 % EX OINT: 1.0000 | TOPICAL_OINTMENT | Freq: Two times a day (BID) | CUTANEOUS | Status: DC

## 2023-06-18 NOTE — Patient Instructions (Addendum)
 Thank you for coming to the office today.  Likely yeast infection, inc risk w/ diabetes. Can also be bacterial or other cause mostly sensitive irritated. Can heal with topical ointment - rx mupirocin antibacterial version  Also can use topical hydrocortisone 1% ointment as needed for irritation.  Start Valtrex 500mg  daily for suppression. I don't think this is a HSV infection today  Please schedule a Follow-up Appointment to: Return if symptoms worsen or fail to improve.  If you have any other questions or concerns, please feel free to call the office or send a message through MyChart. You may also schedule an earlier appointment if necessary.  Additionally, you may be receiving a survey about your experience at our office within a few days to 1 week by e-mail or mail. We value your feedback.  Saralyn Pilar, DO [facility], [place]

## 2023-06-18 NOTE — Telephone Encounter (Signed)
  Chief Complaint: redness/itching glans penis and foreskin Symptoms: redness with tiny white bumps to "end of penis" and foreskin. Very itchy . No drainage. Reports has not had sex for extended time Frequency: few days ago  Pertinent Negatives: Patient denies fever no drainage no severe pain  Disposition: [] ED /[] Urgent Care (no appt availability in office) / [x] Appointment(In office/virtual)/ []  [facility]/ [] Home Care/ [] Refused Recommended Disposition /[] [place] [facility]/ []  Follow-up with PCP Additional Notes:   No available appt with PCP or other provider in office. Appt scheduled with male provider as requested for today at [facility] acute visit .        Copied from CRM [id number]. Topic: Clinical - Medical Advice >> Jun 18, 2023 10:48 AM Patsy Lager T wrote: Reason for CRM: patient stated he noticed a few days ago red/itchy bumps around he head of his penis. Patient is requesting for a medication for the symptom. Reason for Disposition  [1] Looks infected (spreading redness, pus) AND [2] diabetes mellitus or weak immune system (e.g., HIV positive, cancer chemo, splenectomy, organ transplant, chronic steroids)    Unsure if looks infected. No pus or drainage reported patient has hx DM  Answer Assessment - Initial Assessment Questions 1. APPEARANCE of RASH: "Describe the rash."      Redness around end of penis and foreskin with tiny bumps noted 2. LOCATION: "Where is the rash located?"      End of penis and foreskin 3. NUMBER: "How many spots are there?"      na 4. SIZE: "How big are the spots?" (Inches, centimeters or compare to size of a coin)      tiny 5. ONSET: "When did the rash start?"      Few days ago  6. ITCHING: "Does the rash itch?" If Yes, ask: "How bad is the itch?"  (Scale 0-10; or none, mild, moderate, severe)     Yes  7. PAIN: "Does the rash hurt?" If Yes, ask: "How bad is the pain?"  (Scale 0-10; or none, mild, moderate, severe)    - NONE (0): no pain     - MILD (1-3): doesn't interfere with normal activities     - MODERATE (4-7): interferes with normal activities or awakens from sleep     - SEVERE (8-10): excruciating pain, unable to do any normal activities     no 8. OTHER SYMPTOMS: "Do you have any other symptoms?" (e.g., fever)     No other sx hx dm 9. PREGNANCY: "Is there any chance you are pregnant?" "When was your last menstrual period?"     Na  Protocols used: Rash or Redness - Localized-A-AH

## 2023-06-18 NOTE — Progress Notes (Signed)
 Subjective:    Patient ID: William Reid, male    DOB: 1990/09/10, 33 y.o.   MRN: [id number]  [name] is a 33 y.o. male presenting on 06/18/2023 for Penile symptoms - rash, balanitis  Patient presents for an ACUTE or SAME DAY appointment.  PCP Larae Grooms FNP at [facility]  Note - This patient's PCP works out of a different office within the [employer] [facility] region. They have been scheduled with our office for a one time ACUTE visit today due to limited availability for acute appointment with their PCP's office. Additional follow-up and management beyond the scope of the discussion and treatment for today's visit will be addressed by their PCP's office.   HPI  Discussed the use of AI scribe software for clinical note transcription with the patient, who gave verbal consent to proceed.  History of Present Illness   William Reid is a 33 year old male with herpes who presents with a penile rash and itchiness.  He has been experiencing a rash and itchiness around the head of his penis for the past few days. The rash is primarily redness with significant itchiness, though not severe. The foreskin becomes swollen and feels rough and sore, particularly after itching. There are a few tiny bumps that do not resemble his typical herpes lesions, possibly resembling blisters.  He attempted self-treatment with lotion and baby powder, which provided temporary relief but ultimately worsened the condition by drying out the skin. He attributes the rash to poor hygiene, including wearing the same underwear for multiple days and not showering daily. He has not changed any soaps or cleansing products recently, aside from the lotion and baby powder used for the rash.  He was recently diagnosed with diabetes and has not yet started his prescribed medication. He is uncircumcised, with foreskin that partially covers the glans, which he believes may contribute to the  issue. He has a history of herpes but has not experienced a confirmed flare-up recently. No severe itchiness or typical herpes lesions. Reports tenderness on the left side of the penis.          06/18/2023    2:24 PM 06/15/2023    1:25 PM 09/17/2022   10:22 AM  Depression screen PHQ 2/9  Decreased Interest 0 1 1  Down, Depressed, Hopeless 0 0 0  PHQ - 2 Score 0 1 1  Altered sleeping 0 0 2  Tired, decreased energy 0 1 1  Change in appetite 0 1 1  Feeling bad or failure about yourself  0 0 1  Trouble concentrating 0 1 1  Moving slowly or fidgety/restless 0 0 0  Suicidal thoughts 0 0 0  PHQ-9 Score 0 4 7       06/18/2023    2:25 PM 06/15/2023    1:24 PM 09/17/2022   10:23 AM 08/27/2022    2:03 PM  GAD 7 : Generalized Anxiety Score  Nervous, Anxious, on Edge 0 0 1 1  Control/stop worrying 0 0 1 1  Worry too much - different things 0 0 1 1  Trouble relaxing 0 1 1 1   Restless 0 0 1 1  Easily annoyed or irritable 2 2 0 0  Afraid - awful might happen 0 0 0 0  Total GAD 7 Score 2 3 5 5   Anxiety Difficulty Not difficult at all   Somewhat difficult    Social History   Tobacco Use   Smoking status:  Former   Smokeless tobacco: Never  Advertising account planner   Vaping status: Never Used  Substance Use Topics   Alcohol use: Yes    Comment: occasionally   Drug use: Yes    Types: Marijuana    Comment: very rare    Review of Systems Per HPI unless specifically indicated above     Objective:    BP (!) 144/90 (BP Location: Left Arm, Patient Position: Sitting)   Pulse 87   Ht 6' 4.18" (1.935 m)   Wt (!) 401 lb (181.9 kg)   SpO2 94%   BMI 48.58 kg/m   Wt Readings from Last 3 Encounters:  06/18/23 (!) 401 lb (181.9 kg)  06/15/23 (!) 395 lb 6.4 oz (179.4 kg)  02/19/23 (!) 412 lb (186.9 kg)    Physical Exam Vitals and nursing note reviewed.  Constitutional:      General: He is not in acute distress.    Appearance: Normal appearance. He is well-developed. He is not diaphoretic.      Comments: Well-appearing, comfortable, cooperative  HENT:     Head: Normocephalic and atraumatic.  Eyes:     General:        Right eye: No discharge.        Left eye: No discharge.     Conjunctiva/sclera: Conjunctivae normal.  Cardiovascular:     Rate and Rhythm: Normal rate.  Pulmonary:     Effort: Pulmonary effort is normal.  Genitourinary:    Comments: External male genital exam performed today  glans penis and foreskin without obvious rash or blister seen. Foreskin is easily reducible. No obvious ulceration on exam. He is uncircumcised and has some slight adhesions and moisture around glans penis and irritation of skin superficially around corona L>R aspect. Skin:    General: Skin is warm and dry.     Findings: No erythema or rash.  Neurological:     Mental Status: He is alert and oriented to person, place, and time.  Psychiatric:        Mood and Affect: Mood normal.        Behavior: Behavior normal.        Thought Content: Thought content normal.     Comments: Well groomed, good eye contact, normal speech and thoughts     Results for orders placed or performed in visit on 06/15/23  Microscopic Examination   Collection Time: 06/15/23  1:31 PM   Urine  Result Value Ref Range   WBC, UA 0-5 0 - 5 /hpf   RBC, Urine 0-2 0 - 2 /hpf   Epithelial Cells (non renal) 0-10 0 - 10 /hpf   Mucus, UA Present (A) Not Estab.   Bacteria, UA Few (A) None seen/Few  Urinalysis, Routine w reflex microscopic   Collection Time: 06/15/23  1:31 PM  Result Value Ref Range   Specific Gravity, UA >1.030 (H) 1.005 - 1.030   pH, UA 6.0 5.0 - 7.5   Color, UA Yellow Yellow   Appearance Ur Clear Clear   Leukocytes,UA Negative Negative   Protein,UA 1+ (A) Negative/Trace   Glucose, UA 3+ (A) Negative   Ketones, UA Trace (A) Negative   RBC, UA Trace (A) Negative   Bilirubin, UA Negative Negative   Urobilinogen, Ur 1.0 0.2 - 1.0 mg/dL   Nitrite, UA Negative Negative   Microscopic Examination See  below:   Comp Met (CMET)   Collection Time: 06/15/23  1:31 PM  Result Value Ref Range   Glucose 307 (H) 70 -  99 mg/dL   BUN 11 6 - 20 mg/dL   Creatinine, Ser 1.09 0.76 - 1.27 mg/dL   eGFR 323 >55 DD/UKG/2.54   BUN/Creatinine Ratio 13 9 - 20   Sodium 137 134 - 144 mmol/L   Potassium 4.3 3.5 - 5.2 mmol/L   Chloride 98 96 - 106 mmol/L   CO2 23 20 - 29 mmol/L   Calcium 9.4 8.7 - 10.2 mg/dL   Total Protein 7.4 6.0 - 8.5 g/dL   Albumin 4.5 4.1 - 5.1 g/dL   Globulin, Total 2.9 1.5 - 4.5 g/dL   Bilirubin Total 0.5 0.0 - 1.2 mg/dL   Alkaline Phosphatase 110 44 - 121 IU/L   AST 59 (H) 0 - 40 IU/L   ALT 91 (H) 0 - 44 IU/L  Bayer DCA Hb A1c Waived   Collection Time: 06/15/23  1:31 PM  Result Value Ref Range   HB A1C (BAYER DCA - WAIVED) 10.4 (H) 4.8 - 5.6 %      Assessment & Plan:   Problem List Items Addressed This Visit   None Visit Diagnoses       Candidal balanitis    -  Primary   Relevant Medications   valACYclovir (VALTREX) 500 MG tablet   fluconazole (DIFLUCAN) 150 MG tablet   mupirocin ointment (BACTROBAN) 2 %   hydrocortisone 1 % ointment     Herpes simplex infection of penis       Relevant Medications   valACYclovir (VALTREX) 500 MG tablet   fluconazole (DIFLUCAN) 150 MG tablet   mupirocin ointment (BACTROBAN) 2 %        Penile Balanitis No obvious ulceration or lesions today. No discharge. Likely yeast infection exacerbated by diabetes and poor hygiene recently with inc moisture in setting of uncircumcised Not classic for HSV flare  - Prescribed fluconazole 150 mg, take one pill on day one, skip day two, and take the second pill on day three. - Advised use of over-the-counter 1% hydrocortisone ointment for reduce discomfort and irritation - Prescribed Mupirocin antibiotic ointment for topical application. - Advised against using lotions or creams that may dry out the skin. - Recommended using plain Vaseline for lubrication after resolution.  Diabetes  Mellitus, recent new diagnosis Recently diagnosed, increasing risk of yeast infections. Initiated insulin therapy. - Ensure follow-up with primary care provider for diabetes management. Not focus of visit today  Genital Herpes Simplex Virus (HSV) No recent flare-ups. Current symptoms do not suggest herpes outbreak. Anxiety about potential flare-ups. - Prescribed Valtrex 500 mg for suppression, take one pill daily - 30 day order, without refills - Advised to increase to 500 mg twice daily for 3-5 days if flare-up occurs if he prefers to take AS NEEDED for flares. - He can discuss with PCP about plan to continue suppression vs use AS NEEDED in future   No orders of the defined types were placed in this encounter.   Meds ordered this encounter  Medications   valACYclovir (VALTREX) 500 MG tablet    Sig: Take 1 tablet (500 mg total) by mouth daily. For daily suppression HSV    Dispense:  30 tablet    Refill:  0   fluconazole (DIFLUCAN) 150 MG tablet    Sig: Take one tablet by mouth on Day 1. Repeat dose 2nd tablet on Day 3.    Dispense:  2 tablet    Refill:  0   mupirocin ointment (BACTROBAN) 2 %    Sig: Apply 1 Application topically 2 (  two) times daily. For 1-2 weeks as needed to affected areas.    Dispense:  22 g    Refill:  0   hydrocortisone 1 % ointment    Sig: Apply 1 Application topically 2 (two) times daily.    Follow up plan: Return if symptoms worsen or fail to improve.   Saralyn Pilar, DO [facility] 06/18/2023, 2:36 PM

## 2023-06-23 NOTE — Telephone Encounter (Signed)
 PA has been approved. Called and LVM notifying patient of approval.

## 2023-07-01 ENCOUNTER — Ambulatory Visit: Admitting: Nurse Practitioner

## 2023-07-01 ENCOUNTER — Encounter: Payer: Self-pay | Admitting: Nurse Practitioner

## 2023-07-01 VITALS — BP 135/86 | HR 73 | Temp 97.7°F | Wt 388.4 lb

## 2023-07-01 DIAGNOSIS — H579 Unspecified disorder of eye and adnexa: Secondary | ICD-10-CM | POA: Diagnosis not present

## 2023-07-01 DIAGNOSIS — I10 Essential (primary) hypertension: Secondary | ICD-10-CM

## 2023-07-01 MED ORDER — VALSARTAN 160 MG PO TABS: 160.0000 mg | ORAL_TABLET | Freq: Every day | ORAL | 1 refills | Status: AC

## 2023-07-01 NOTE — Assessment & Plan Note (Signed)
 Chronic. Not well controlled.  Will increase dose of medication to Valsartan 160mg  daily.  Follow up in 2 weeks.  Call sooner if concerns arise.

## 2023-07-01 NOTE — Progress Notes (Signed)
 BP 135/86   Pulse 73   Temp 97.7 F (36.5 C) (Oral)   Wt (!) 388 lb 6.4 oz (176.2 kg)   SpO2 98%   BMI 47.05 kg/m    Subjective:    Patient ID: William Reid, male    DOB: July 25, 1990, 33 y.o.   MRN: [id number]  HPI: William Reid is a 33 y.o. male  Chief Complaint  Patient presents with   Eye Problem    Pt states that for the last couple of days his eyes wants to wander off    Patient states he has been having trouble with his eyes.  For the last few days- better for the last 2 days.  He was doing a wield test two days ago. States he was able to see it and look at it but feels like his eyes are wandering and down to the left.  He does feel like they might be blurry.  After he blinks and focuses it gets better.  His eyes do feel strained.  He noticed it when he was driving.  Symptoms are there more when he is trying to focus on something.  If he doesn't focus on it it isn't as bad. Doesn't happen all the time but occasional.    He did start Mounjaro recently and has lost 13lbs.  He wasn't sure if had eaten prior.  If he did he felt like it was very little.   HYPERTENSION without Chronic Kidney Disease Hypertension status: uncontrolled  Satisfied with current treatment? yes Duration of hypertension: years BP monitoring frequency:  not checking BP range:  BP medication side effects:  no Medication compliance: excellent compliance Previous BP meds:valsartan Aspirin: no Recurrent headaches: no Visual changes: yes Palpitations: no Dyspnea: no Chest pain: no Lower extremity edema: no Dizzy/lightheaded: no    Relevant past medical, surgical, family and social history reviewed and updated as indicated. Interim medical history since our last visit reviewed. Allergies and medications reviewed and updated.  Review of Systems  Constitutional:  Negative for unexpected weight change.  Eyes:  Positive for visual disturbance.  Respiratory:  Negative for shortness of breath.    Cardiovascular:  Negative for chest pain and leg swelling.  Neurological:  Negative for light-headedness and headaches.    Per HPI unless specifically indicated above     Objective:    BP 135/86   Pulse 73   Temp 97.7 F (36.5 C) (Oral)   Wt (!) 388 lb 6.4 oz (176.2 kg)   SpO2 98%   BMI 47.05 kg/m   Wt Readings from Last 3 Encounters:  07/01/23 (!) 388 lb 6.4 oz (176.2 kg)  06/18/23 (!) 401 lb (181.9 kg)  06/15/23 (!) 395 lb 6.4 oz (179.4 kg)    Physical Exam Vitals and nursing note reviewed.  Constitutional:      General: He is not in acute distress.    Appearance: Normal appearance. He is obese. He is not ill-appearing, toxic-appearing or diaphoretic.  HENT:     Head: Normocephalic.     Right Ear: External ear normal.     Left Ear: External ear normal.     Nose: Nose normal. No congestion or rhinorrhea.     Mouth/Throat:     Mouth: Mucous membranes are moist.  Eyes:     General:        Right eye: No discharge.        Left eye: No discharge.     Extraocular Movements: Extraocular movements  intact.     Conjunctiva/sclera: Conjunctivae normal.     Pupils: Pupils are equal, round, and reactive to light.  Cardiovascular:     Rate and Rhythm: Normal rate and regular rhythm.     Heart sounds: No murmur heard. Pulmonary:     Effort: Pulmonary effort is normal. No respiratory distress.     Breath sounds: Normal breath sounds. No wheezing, rhonchi or rales.  Abdominal:     General: Abdomen is flat. Bowel sounds are normal.  Musculoskeletal:     Cervical back: Normal range of motion and neck supple.  Skin:    General: Skin is warm and dry.     Capillary Refill: Capillary refill takes less than 2 seconds.  Neurological:     General: No focal deficit present.     Mental Status: He is alert and oriented to person, place, and time.     Cranial Nerves: Cranial nerves 2-12 are intact.     Sensory: Sensation is intact.     Motor: Motor function is intact.      Coordination: Coordination is intact.     Gait: Gait is intact.  Psychiatric:        Mood and Affect: Mood normal.        Behavior: Behavior normal.        Thought Content: Thought content normal.        Judgment: Judgment normal.     Results for orders placed or performed in visit on 06/15/23  Microscopic Examination   Collection Time: 06/15/23  1:31 PM   Urine  Result Value Ref Range   WBC, UA 0-5 0 - 5 /hpf   RBC, Urine 0-2 0 - 2 /hpf   Epithelial Cells (non renal) 0-10 0 - 10 /hpf   Mucus, UA Present (A) Not Estab.   Bacteria, UA Few (A) None seen/Few  Urinalysis, Routine w reflex microscopic   Collection Time: 06/15/23  1:31 PM  Result Value Ref Range   Specific Gravity, UA >1.030 (H) 1.005 - 1.030   pH, UA 6.0 5.0 - 7.5   Color, UA Yellow Yellow   Appearance Ur Clear Clear   Leukocytes,UA Negative Negative   Protein,UA 1+ (A) Negative/Trace   Glucose, UA 3+ (A) Negative   Ketones, UA Trace (A) Negative   RBC, UA Trace (A) Negative   Bilirubin, UA Negative Negative   Urobilinogen, Ur 1.0 0.2 - 1.0 mg/dL   Nitrite, UA Negative Negative   Microscopic Examination See below:   Comp Met (CMET)   Collection Time: 06/15/23  1:31 PM  Result Value Ref Range   Glucose 307 (H) 70 - 99 mg/dL   BUN 11 6 - 20 mg/dL   Creatinine, Ser 2.44 0.76 - 1.27 mg/dL   eGFR 010 >27 OZ/DGU/4.40   BUN/Creatinine Ratio 13 9 - 20   Sodium 137 134 - 144 mmol/L   Potassium 4.3 3.5 - 5.2 mmol/L   Chloride 98 96 - 106 mmol/L   CO2 23 20 - 29 mmol/L   Calcium 9.4 8.7 - 10.2 mg/dL   Total Protein 7.4 6.0 - 8.5 g/dL   Albumin 4.5 4.1 - 5.1 g/dL   Globulin, Total 2.9 1.5 - 4.5 g/dL   Bilirubin Total 0.5 0.0 - 1.2 mg/dL   Alkaline Phosphatase 110 44 - 121 IU/L   AST 59 (H) 0 - 40 IU/L   ALT 91 (H) 0 - 44 IU/L  Bayer DCA Hb A1c Waived   Collection Time: 06/15/23  1:31 PM  Result Value Ref Range   HB A1C (BAYER DCA - WAIVED) 10.4 (H) 4.8 - 5.6 %      Assessment & Plan:   Problem List  Items Addressed This Visit   None Visit Diagnoses       Eye problems    -  Primary New problem. Improved in the last two days.  No red flags on exam.  Encouraged patient to make sure he is eating regularly with now being on Mounjaro. Want patient to see Ophthalmologist.  Will also increase blood pressure medication.  Follow up in 2 weeks, if symptoms not improved, will order MRI.        Follow up plan: Return in about 2 weeks (around 07/15/2023) for Eye Concern FU.

## 2023-07-21 ENCOUNTER — Ambulatory Visit: Admitting: Nurse Practitioner

## 2023-07-27 ENCOUNTER — Ambulatory Visit: Admitting: Nurse Practitioner

## 2023-07-27 ENCOUNTER — Encounter: Payer: Self-pay | Admitting: Nurse Practitioner

## 2023-07-27 VITALS — BP 132/76 | HR 71 | Temp 98.4°F | Resp 18 | Ht 76.18 in | Wt 379.4 lb

## 2023-07-27 DIAGNOSIS — E119 Type 2 diabetes mellitus without complications: Secondary | ICD-10-CM | POA: Diagnosis not present

## 2023-07-27 DIAGNOSIS — Z7985 Long-term (current) use of injectable non-insulin antidiabetic drugs: Secondary | ICD-10-CM

## 2023-07-27 DIAGNOSIS — I10 Essential (primary) hypertension: Secondary | ICD-10-CM | POA: Diagnosis not present

## 2023-07-27 DIAGNOSIS — E1165 Type 2 diabetes mellitus with hyperglycemia: Secondary | ICD-10-CM

## 2023-07-27 MED ORDER — TIRZEPATIDE 7.5 MG/0.5ML ~~LOC~~ SOAJ: 7.5000 mg | SUBCUTANEOUS | 1 refills | Status: DC

## 2023-07-27 NOTE — Progress Notes (Signed)
 BP 132/76 (BP Location: Left Arm, Patient Position: Sitting, Cuff Size: Large)   Pulse 71   Temp 98.4 F (36.9 C) (Oral)   Resp 18   Ht 6' 4.18" (1.935 m)   Wt (!) 379 lb 6.4 oz (172.1 kg)   SpO2 95%   BMI 45.96 kg/m    Subjective:    Patient ID: William Reid, male    DOB: [birth year], 33 y.o.   MRN: [id number]  HPI: William Reid is a 33 y.o. male  Chief Complaint  Patient presents with   Hypertension    No checks at home.    Diabetes    No home checks. Has been taking 5 mg weekly Mounjaro. No side effects.    Dizziness    Had one bout Saturday and had not eaten. Once he ate things resolved after awhile.    Eye Problem    Started to resolve some 2 weeks ago. Still some residual.    HYPERTENSION / HYPERLIPIDEMIA Satisfied with current treatment? yes Duration of hypertension: years BP monitoring frequency: not checking BP range:  BP medication side effects: no Past BP meds: valsartan  Duration of hyperlipidemia: years Cholesterol medication side effects: no Cholesterol supplements: none Past cholesterol medications: rosuvastatin  (crestor ) Medication compliance: excellent compliance Aspirin: no Recent stressors: no Recurrent headaches: no Visual changes: no Palpitations: no Dyspnea: no Chest pain: no Lower extremity edema: no Dizzy/lightheaded: no  DIABETES Patient states he is tolerating the mounjaro well.  He is down 22lbs since his last visit.   Hypoglycemic episodes:yes Polydipsia/polyuria: no Visual disturbance: no Chest pain: no Paresthesias: no Glucose Monitoring: no  Accucheck frequency: Not Checking  Fasting glucose:  Post prandial:  Evening:  Before meals: Taking Insulin?: no  Long acting insulin:  Short acting insulin: Blood Pressure Monitoring: not checking Retinal Examination: Not up to Date Foot Exam: Up to Date Diabetic Education: Not Completed Pneumovax: Up to Date Influenza: Up to Date Aspirin: no  Relevant past medical,  surgical, family and social history reviewed and updated as indicated. Interim medical history since our last visit reviewed. Allergies and medications reviewed and updated.  Review of Systems  Eyes:  Negative for visual disturbance.  Respiratory:  Negative for chest tightness and shortness of breath.   Cardiovascular:  Negative for chest pain, palpitations and leg swelling.  Endocrine: Negative for polydipsia and polyuria.  Neurological:  Positive for dizziness. Negative for light-headedness, numbness and headaches.    Per HPI unless specifically indicated above     Objective:    BP 132/76 (BP Location: Left Arm, Patient Position: Sitting, Cuff Size: Large)   Pulse 71   Temp 98.4 F (36.9 C) (Oral)   Resp 18   Ht 6' 4.18" (1.935 m)   Wt (!) 379 lb 6.4 oz (172.1 kg)   SpO2 95%   BMI 45.96 kg/m   Wt Readings from Last 3 Encounters:  07/27/23 (!) 379 lb 6.4 oz (172.1 kg)  07/01/23 (!) 388 lb 6.4 oz (176.2 kg)  06/18/23 (!) 401 lb (181.9 kg)    Physical Exam Vitals and nursing note reviewed.  Constitutional:      General: He is not in acute distress.    Appearance: Normal appearance. He is obese. He is not ill-appearing, toxic-appearing or diaphoretic.  HENT:     Head: Normocephalic.     Right Ear: External ear normal.     Left Ear: External ear normal.     Nose: Nose normal. No congestion or rhinorrhea.  Mouth/Throat:     Mouth: Mucous membranes are moist.  Eyes:     General:        Right eye: No discharge.        Left eye: No discharge.     Extraocular Movements: Extraocular movements intact.     Conjunctiva/sclera: Conjunctivae normal.     Pupils: Pupils are equal, round, and reactive to light.  Cardiovascular:     Rate and Rhythm: Normal rate and regular rhythm.     Heart sounds: No murmur heard. Pulmonary:     Effort: Pulmonary effort is normal. No respiratory distress.     Breath sounds: Normal breath sounds. No wheezing, rhonchi or rales.  Abdominal:      General: Abdomen is flat. Bowel sounds are normal.  Musculoskeletal:     Cervical back: Normal range of motion and neck supple.  Skin:    General: Skin is warm and dry.     Capillary Refill: Capillary refill takes less than 2 seconds.  Neurological:     General: No focal deficit present.     Mental Status: He is alert and oriented to person, place, and time.  Psychiatric:        Mood and Affect: Mood normal.        Behavior: Behavior normal.        Thought Content: Thought content normal.        Judgment: Judgment normal.     Results for orders placed or performed in visit on 06/15/23  Microscopic Examination   Collection Time: 06/15/23  1:31 PM   Urine  Result Value Ref Range   WBC, UA 0-5 0 - 5 /hpf   RBC, Urine 0-2 0 - 2 /hpf   Epithelial Cells (non renal) 0-10 0 - 10 /hpf   Mucus, UA Present (A) Not Estab.   Bacteria, UA Few (A) None seen/Few  Urinalysis, Routine w reflex microscopic   Collection Time: 06/15/23  1:31 PM  Result Value Ref Range   Specific Gravity, UA >1.030 (H) 1.005 - 1.030   pH, UA 6.0 5.0 - 7.5   Color, UA Yellow Yellow   Appearance Ur Clear Clear   Leukocytes,UA Negative Negative   Protein,UA 1+ (A) Negative/Trace   Glucose, UA 3+ (A) Negative   Ketones, UA Trace (A) Negative   RBC, UA Trace (A) Negative   Bilirubin, UA Negative Negative   Urobilinogen, Ur 1.0 0.2 - 1.0 mg/dL   Nitrite, UA Negative Negative   Microscopic Examination See below:   Comp Met (CMET)   Collection Time: 06/15/23  1:31 PM  Result Value Ref Range   Glucose 307 (H) 70 - 99 mg/dL   BUN 11 6 - 20 mg/dL   Creatinine, Ser 1.61 0.76 - 1.27 mg/dL   eGFR 096 >04 VW/UJW/1.19   BUN/Creatinine Ratio 13 9 - 20   Sodium 137 134 - 144 mmol/L   Potassium 4.3 3.5 - 5.2 mmol/L   Chloride 98 96 - 106 mmol/L   CO2 23 20 - 29 mmol/L   Calcium  9.4 8.7 - 10.2 mg/dL   Total Protein 7.4 6.0 - 8.5 g/dL   Albumin 4.5 4.1 - 5.1 g/dL   Globulin, Total 2.9 1.5 - 4.5 g/dL   Bilirubin  Total 0.5 0.0 - 1.2 mg/dL   Alkaline Phosphatase 110 44 - 121 IU/L   AST 59 (H) 0 - 40 IU/L   ALT 91 (H) 0 - 44 IU/L  Bayer DCA Hb A1c Waived  Collection Time: 06/15/23  1:31 PM  Result Value Ref Range   HB A1C (BAYER DCA - WAIVED) 10.4 (H) 4.8 - 5.6 %      Assessment & Plan:   Problem List Items Addressed This Visit       Cardiovascular and Mediastinum   Primary hypertension   Chronic.  Controlled.  Continue with current medication regimen.   Return to clinic in 6 months for reevaluation.  Call sooner if concerns arise.          Endocrine   Uncontrolled type 2 diabetes mellitus with hyperglycemia (HCC) - Primary   Improved.  A1c of 10.4% in March.  Lengthy discussion had regarding lifestyle changes, medications, and recommendations.  Will increase Mounjaro 7.5mg  weekly.  Side effects and benefits of medication discussed during visit.   Follow up in 1 month.  Call sooner if concerns arise.       Relevant Medications   tirzepatide (MOUNJARO) 7.5 MG/0.5ML Pen   Diabetes mellitus treated with injections of non-insulin medication (HCC)   Relevant Medications   tirzepatide (MOUNJARO) 7.5 MG/0.5ML Pen     Other   Morbid obesity (HCC)   Recommended eating smaller high protein, low fat meals more frequently and exercising 30 mins a day 5 times a week with a goal of 10-15lb weight loss in the next 3 months. Mounjaro dose increased.      Relevant Medications   tirzepatide (MOUNJARO) 7.5 MG/0.5ML Pen     Follow up plan: Return in about 3 weeks (around 08/17/2023) for HTN, HLD, DM2 FU.

## 2023-07-27 NOTE — Assessment & Plan Note (Signed)
 Recommended eating smaller high protein, low fat meals more frequently and exercising 30 mins a day 5 times a week with a goal of 10-15lb weight loss in the next 3 months. Mounjaro dose increased.

## 2023-07-27 NOTE — Assessment & Plan Note (Signed)
Chronic.  Controlled.  Continue with current medication regimen.  Return to clinic in 6 months for reevaluation.  Call sooner if concerns arise.  ? ?

## 2023-07-27 NOTE — Assessment & Plan Note (Signed)
 Improved.  A1c of 10.4% in March.  Lengthy discussion had regarding lifestyle changes, medications, and recommendations.  Will increase Mounjaro 7.5mg  weekly.  Side effects and benefits of medication discussed during visit.   Follow up in 1 month.  Call sooner if concerns arise.

## 2023-07-28 ENCOUNTER — Ambulatory Visit: Admitting: Nurse Practitioner

## 2023-08-02 ENCOUNTER — Encounter: Admitting: Nurse Practitioner

## 2023-08-10 ENCOUNTER — Ambulatory Visit: Admitting: Nurse Practitioner

## 2023-08-17 ENCOUNTER — Ambulatory Visit (INDEPENDENT_AMBULATORY_CARE_PROVIDER_SITE_OTHER): Admitting: Nurse Practitioner

## 2023-08-17 ENCOUNTER — Encounter: Payer: Self-pay | Admitting: Nurse Practitioner

## 2023-08-17 VITALS — BP 109/72 | HR 102 | Temp 98.4°F | Resp 17 | Ht 76.18 in | Wt 354.2 lb

## 2023-08-17 DIAGNOSIS — A084 Viral intestinal infection, unspecified: Secondary | ICD-10-CM

## 2023-08-17 DIAGNOSIS — E119 Type 2 diabetes mellitus without complications: Secondary | ICD-10-CM | POA: Diagnosis not present

## 2023-08-17 DIAGNOSIS — Z7985 Long-term (current) use of injectable non-insulin antidiabetic drugs: Secondary | ICD-10-CM | POA: Diagnosis not present

## 2023-08-17 NOTE — Progress Notes (Signed)
 BP 109/72 (BP Location: Left Arm, Patient Position: Sitting, Cuff Size: Large)   Pulse (!) 102   Temp 98.4 F (36.9 C) (Oral)   Resp 17   Ht 6' 4.18" (1.935 m)   Wt (!) 354 lb 3.2 oz (160.7 kg)   SpO2 98%   BMI 42.91 kg/m    Subjective:    Patient ID: William Reid, male    DOB: May 12, 1990, 33 y.o.   MRN: [id number]  HPI: William Reid is a 33 y.o. male  Chief Complaint  Patient presents with   Abdominal Pain    Started with diarrhea since last week. Pain is nausea/vomiting, sheering pain, cramping, burping as well. Also not eating well and feels it is much worse at night. Is very hungry but scared to eat. Drinking Gatorade to try and stay hydrated. 2-3 weeks ago had been around a friend that had similar symptoms.    Diabetes    Has not taken this week Mounjaro dose due to GI upset. 1st dose of 5mg  did fine but after 2nd dose was around same time as he started to have pain.    Anxiety    Does feel anxiety has gotten worse since he hasn't felt good, angry more than normal and just over all feeling overwhelmed. Also not sleeping well.    DIABETES Patient states he is tolerating the mounjaro well.  He is down another 25lbs.   Hypoglycemic episodes:yes Polydipsia/polyuria: no Visual disturbance: no Chest pain: no Paresthesias: no Glucose Monitoring: no  Accucheck frequency: Not Checking  Fasting glucose:  Post prandial:  Evening:  Before meals: Taking Insulin?: no  Long acting insulin:  Short acting insulin: Blood Pressure Monitoring: not checking Retinal Examination: Not up to Date Foot Exam: Up to Date Diabetic Education: Not Completed Pneumovax: Up to Date Influenza: Up to Date Aspirin: no  ABDOMINAL ISSUES Duration: about a week Nature: nausea and cramping Location: diffuse  Severity: moderate  Radiation: no Episode duration: Frequency: intermittent Alleviating factors: eating Aggravating factors: not eating Treatments attempted: none Constipation:  no Diarrhea: yes Episodes of diarrhea/day: some days 3-5  Mucous in the stool: no Heartburn: no Bloating:yes Flatulence: no Nausea: yes Vomiting: yes Episodes of vomit/day: the past couple of days  Melena or hematochezia: no Rash: no Jaundice: no Fever: no Weight loss: yes- but intentional    Relevant past medical, surgical, family and social history reviewed and updated as indicated. Interim medical history since our last visit reviewed. Allergies and medications reviewed and updated.  Review of Systems  Eyes:  Negative for visual disturbance.  Respiratory:  Negative for chest tightness and shortness of breath.   Cardiovascular:  Negative for chest pain, palpitations and leg swelling.  Gastrointestinal:  Positive for abdominal pain, diarrhea, nausea and vomiting.  Endocrine: Negative for polydipsia and polyuria.  Neurological:  Negative for dizziness, light-headedness, numbness and headaches.    Per HPI unless specifically indicated above     Objective:    BP 109/72 (BP Location: Left Arm, Patient Position: Sitting, Cuff Size: Large)   Pulse (!) 102   Temp 98.4 F (36.9 C) (Oral)   Resp 17   Ht 6' 4.18" (1.935 m)   Wt (!) 354 lb 3.2 oz (160.7 kg)   SpO2 98%   BMI 42.91 kg/m   Wt Readings from Last 3 Encounters:  08/17/23 (!) 354 lb 3.2 oz (160.7 kg)  07/27/23 (!) 379 lb 6.4 oz (172.1 kg)  07/01/23 (!) 388 lb 6.4 oz (176.2  kg)    Physical Exam Vitals and nursing note reviewed.  Constitutional:      General: He is not in acute distress.    Appearance: Normal appearance. He is obese. He is not ill-appearing, toxic-appearing or diaphoretic.  HENT:     Head: Normocephalic.     Right Ear: External ear normal.     Left Ear: External ear normal.     Nose: Nose normal. No congestion or rhinorrhea.     Mouth/Throat:     Mouth: Mucous membranes are moist.  Eyes:     General:        Right eye: No discharge.        Left eye: No discharge.     Extraocular  Movements: Extraocular movements intact.     Conjunctiva/sclera: Conjunctivae normal.     Pupils: Pupils are equal, round, and reactive to light.  Cardiovascular:     Rate and Rhythm: Normal rate and regular rhythm.     Heart sounds: No murmur heard. Pulmonary:     Effort: Pulmonary effort is normal. No respiratory distress.     Breath sounds: Normal breath sounds. No wheezing, rhonchi or rales.  Abdominal:     General: Abdomen is flat. Bowel sounds are normal. There is no distension.     Palpations: Abdomen is soft. There is no mass.     Tenderness: There is no abdominal tenderness. There is no right CVA tenderness, left CVA tenderness, guarding or rebound.     Hernia: No hernia is present.  Musculoskeletal:     Cervical back: Normal range of motion and neck supple.  Skin:    General: Skin is warm and dry.     Capillary Refill: Capillary refill takes less than 2 seconds.  Neurological:     General: No focal deficit present.     Mental Status: He is alert and oriented to person, place, and time.  Psychiatric:        Mood and Affect: Mood normal.        Behavior: Behavior normal.        Thought Content: Thought content normal.        Judgment: Judgment normal.     Results for orders placed or performed in visit on 06/15/23  Microscopic Examination   Collection Time: 06/15/23  1:31 PM   Urine  Result Value Ref Range   WBC, UA 0-5 0 - 5 /hpf   RBC, Urine 0-2 0 - 2 /hpf   Epithelial Cells (non renal) 0-10 0 - 10 /hpf   Mucus, UA Present (A) Not Estab.   Bacteria, UA Few (A) None seen/Few  Urinalysis, Routine w reflex microscopic   Collection Time: 06/15/23  1:31 PM  Result Value Ref Range   Specific Gravity, UA >1.030 (H) 1.005 - 1.030   pH, UA 6.0 5.0 - 7.5   Color, UA Yellow Yellow   Appearance Ur Clear Clear   Leukocytes,UA Negative Negative   Protein,UA 1+ (A) Negative/Trace   Glucose, UA 3+ (A) Negative   Ketones, UA Trace (A) Negative   RBC, UA Trace (A) Negative    Bilirubin, UA Negative Negative   Urobilinogen, Ur 1.0 0.2 - 1.0 mg/dL   Nitrite, UA Negative Negative   Microscopic Examination See below:   Comp Met (CMET)   Collection Time: 06/15/23  1:31 PM  Result Value Ref Range   Glucose 307 (H) 70 - 99 mg/dL   BUN 11 6 - 20 mg/dL   Creatinine, Ser  0.84 0.76 - 1.27 mg/dL   eGFR 161 >09 UE/AVW/0.98   BUN/Creatinine Ratio 13 9 - 20   Sodium 137 134 - 144 mmol/L   Potassium 4.3 3.5 - 5.2 mmol/L   Chloride 98 96 - 106 mmol/L   CO2 23 20 - 29 mmol/L   Calcium  9.4 8.7 - 10.2 mg/dL   Total Protein 7.4 6.0 - 8.5 g/dL   Albumin 4.5 4.1 - 5.1 g/dL   Globulin, Total 2.9 1.5 - 4.5 g/dL   Bilirubin Total 0.5 0.0 - 1.2 mg/dL   Alkaline Phosphatase 110 44 - 121 IU/L   AST 59 (H) 0 - 40 IU/L   ALT 91 (H) 0 - 44 IU/L  Bayer DCA Hb A1c Waived   Collection Time: 06/15/23  1:31 PM  Result Value Ref Range   HB A1C (BAYER DCA - WAIVED) 10.4 (H) 4.8 - 5.6 %      Assessment & Plan:   Problem List Items Addressed This Visit       Endocrine   Diabetes mellitus treated with injections of non-insulin medication (HCC) - Primary   Chronic. Doing well with Mounjaro.  Has been off of it this week due to GI upset.  Continue to hold medication until symptoms resolve.  Has lost 25lbs since last visit. Follow up in 1 month.  Will check labs at next visit.       Relevant Medications   MOUNJARO 5 MG/0.5ML Pen   Other Visit Diagnoses       Viral gastroenteritis       No red flags on exam. Recommend following a BRAT diet.  Hydrate well.  If not improved by Friday, can consider imaging.         Follow up plan: Return in about 1 month (around 09/17/2023) for HTN, HLD, DM2 FU.

## 2023-08-17 NOTE — Assessment & Plan Note (Signed)
 Chronic. Doing well with Mounjaro.  Has been off of it this week due to GI upset.  Continue to hold medication until symptoms resolve.  Has lost 25lbs since last visit. Follow up in 1 month.  Will check labs at next visit.

## 2023-08-19 ENCOUNTER — Encounter: Payer: Self-pay | Admitting: Nurse Practitioner

## 2023-08-19 ENCOUNTER — Ambulatory Visit: Admitting: Nurse Practitioner

## 2023-08-19 VITALS — BP 117/73 | HR 81 | Temp 98.4°F | Resp 18 | Wt 354.0 lb

## 2023-08-19 DIAGNOSIS — E1165 Type 2 diabetes mellitus with hyperglycemia: Secondary | ICD-10-CM | POA: Diagnosis not present

## 2023-08-19 LAB — BAYER DCA HB A1C WAIVED: HB A1C (BAYER DCA - WAIVED): 6.4 % — ABNORMAL HIGH (ref 4.8–5.6)

## 2023-08-19 NOTE — Assessment & Plan Note (Addendum)
 Chronic.  A1c 6.4%.  Suspect patient's symptoms are related to quick drop in blood sugar.  Lengthy discussed had regarding patient's symptoms and diet changes.  Encouraged patient to increase caloric intake.  Patient likely not taking in enough calories.  Abdominal exam improved from last visit.  No red flags on exam.  Follow up in 1 week.

## 2023-08-19 NOTE — Progress Notes (Signed)
 BP 117/73 (BP Location: Left Arm, Patient Position: Sitting, Cuff Size: Large)   Pulse 81   Temp 98.4 F (36.9 C) (Oral)   Resp 18   Wt (!) 354 lb (160.6 kg)   SpO2 97%   BMI 42.89 kg/m    Subjective:    Patient ID: William Reid, male    DOB: 1990/12/21, 33 y.o.   MRN: [id number]  HPI: William Reid is a 33 y.o. male  Chief Complaint  Patient presents with   GI Problem    Felt like he was getting better until yesterday afternoon.    Patient states he has been having diarrhea.  States it starting to be less watery and somewhat formed.  Patient states he woke up today and did not fele well and felt nausea and burpy.  He ate a banana and immediately threw it up.  He is having nausea and cramping.  Feels like his insides are being squeezed.  He is still having 2 stools per day.  Yesterday he ate carrots and a banana then he forgot to eat.  He did feel like his blood sugar got low and had a cheer wine and lunchable and then he started feeling better.  He started feeling low again when he was at home and then after that he had some lean [place] and greens.  Feel like he was really cold and felt like he was going to pass out.     Relevant past medical, surgical, family and social history reviewed and updated as indicated. Interim medical history since our last visit reviewed. Allergies and medications reviewed and updated.  Review of Systems  Constitutional:  Positive for diaphoresis. Negative for fever.  Gastrointestinal:  Positive for nausea. Negative for constipation and diarrhea.  Neurological:  Positive for light-headedness.    Per HPI unless specifically indicated above     Objective:     BP 117/73 (BP Location: Left Arm, Patient Position: Sitting, Cuff Size: Large)   Pulse 81   Temp 98.4 F (36.9 C) (Oral)   Resp 18   Wt (!) 354 lb (160.6 kg)   SpO2 97%   BMI 42.89 kg/m   Wt Readings from Last 3 Encounters:  08/19/23 (!) 354 lb (160.6 kg)  08/17/23 (!) 354 lb  3.2 oz (160.7 kg)  07/27/23 (!) 379 lb 6.4 oz (172.1 kg)    Physical Exam Vitals and nursing note reviewed.  Constitutional:      General: He is not in acute distress.    Appearance: Normal appearance. He is obese. He is not ill-appearing, toxic-appearing or diaphoretic.  HENT:     Head: Normocephalic.     Right Ear: External ear normal.     Left Ear: External ear normal.     Nose: Nose normal. No congestion or rhinorrhea.     Mouth/Throat:     Mouth: Mucous membranes are moist.  Eyes:     General:        Right eye: No discharge.        Left eye: No discharge.     Extraocular Movements: Extraocular movements intact.     Conjunctiva/sclera: Conjunctivae normal.     Pupils: Pupils are equal, round, and reactive to light.  Cardiovascular:     Rate and Rhythm: Normal rate and regular rhythm.     Heart sounds: No murmur heard. Pulmonary:     Effort: Pulmonary effort is normal. No respiratory distress.     Breath sounds: Normal breath  sounds. No wheezing, rhonchi or rales.  Abdominal:     General: Abdomen is flat. Bowel sounds are normal. There is no distension.     Palpations: Abdomen is soft. There is no mass.     Tenderness: There is no abdominal tenderness. There is no right CVA tenderness, left CVA tenderness, guarding or rebound.     Hernia: No hernia is present.  Musculoskeletal:     Cervical back: Normal range of motion and neck supple.  Skin:    General: Skin is warm and dry.     Capillary Refill: Capillary refill takes less than 2 seconds.  Neurological:     General: No focal deficit present.     Mental Status: He is alert and oriented to person, place, and time.  Psychiatric:        Mood and Affect: Mood normal.        Behavior: Behavior normal.        Thought Content: Thought content normal.        Judgment: Judgment normal.     Results for orders placed or performed in visit on 06/15/23  Microscopic Examination   Collection Time: 06/15/23  1:31 PM   Urine   Result Value Ref Range   WBC, UA 0-5 0 - 5 /hpf   RBC, Urine 0-2 0 - 2 /hpf   Epithelial Cells (non renal) 0-10 0 - 10 /hpf   Mucus, UA Present (A) Not Estab.   Bacteria, UA Few (A) None seen/Few  Urinalysis, Routine w reflex microscopic   Collection Time: 06/15/23  1:31 PM  Result Value Ref Range   Specific Gravity, UA >1.030 (H) 1.005 - 1.030   pH, UA 6.0 5.0 - 7.5   Color, UA Yellow Yellow   Appearance Ur Clear Clear   Leukocytes,UA Negative Negative   Protein,UA 1+ (A) Negative/Trace   Glucose, UA 3+ (A) Negative   Ketones, UA Trace (A) Negative   RBC, UA Trace (A) Negative   Bilirubin, UA Negative Negative   Urobilinogen, Ur 1.0 0.2 - 1.0 mg/dL   Nitrite, UA Negative Negative   Microscopic Examination See below:   Comp Met (CMET)   Collection Time: 06/15/23  1:31 PM  Result Value Ref Range   Glucose 307 (H) 70 - 99 mg/dL   BUN 11 6 - 20 mg/dL   Creatinine, Ser 9.56 0.76 - 1.27 mg/dL   eGFR 213 >08 MV/HQI/6.96   BUN/Creatinine Ratio 13 9 - 20   Sodium 137 134 - 144 mmol/L   Potassium 4.3 3.5 - 5.2 mmol/L   Chloride 98 96 - 106 mmol/L   CO2 23 20 - 29 mmol/L   Calcium  9.4 8.7 - 10.2 mg/dL   Total Protein 7.4 6.0 - 8.5 g/dL   Albumin 4.5 4.1 - 5.1 g/dL   Globulin, Total 2.9 1.5 - 4.5 g/dL   Bilirubin Total 0.5 0.0 - 1.2 mg/dL   Alkaline Phosphatase 110 44 - 121 IU/L   AST 59 (H) 0 - 40 IU/L   ALT 91 (H) 0 - 44 IU/L  Bayer DCA Hb A1c Waived   Collection Time: 06/15/23  1:31 PM  Result Value Ref Range   HB A1C (BAYER DCA - WAIVED) 10.4 (H) 4.8 - 5.6 %      Assessment & Plan:   Problem List Items Addressed This Visit       Endocrine   Uncontrolled type 2 diabetes mellitus with hyperglycemia (HCC) - Primary   Chronic.  A1c 6.4%.  Suspect patient's symptoms are related to quick drop in blood sugar.  Lengthy discussed had regarding patient's symptoms and diet changes.  Encouraged patient to increase caloric intake.  Patient likely not taking in enough calories.   Abdominal exam improved from last visit.  No red flags on exam.  Follow up in 1 week.      Relevant Orders   Bayer DCA Hb A1c Waived     Follow up plan: Return in about 1 week (around 08/26/2023) for Medication Management.

## 2023-08-20 ENCOUNTER — Ambulatory Visit: Payer: Self-pay | Admitting: Nurse Practitioner

## 2023-08-26 ENCOUNTER — Ambulatory Visit: Admitting: Nurse Practitioner

## 2023-09-02 ENCOUNTER — Ambulatory Visit: Admitting: Nurse Practitioner

## 2023-09-02 ENCOUNTER — Encounter: Payer: Self-pay | Admitting: Nurse Practitioner

## 2023-09-02 VITALS — BP 133/73 | HR 66 | Ht 76.0 in | Wt 360.0 lb

## 2023-09-02 DIAGNOSIS — Z7985 Long-term (current) use of injectable non-insulin antidiabetic drugs: Secondary | ICD-10-CM

## 2023-09-02 DIAGNOSIS — E1165 Type 2 diabetes mellitus with hyperglycemia: Secondary | ICD-10-CM | POA: Diagnosis not present

## 2023-09-02 MED ORDER — TIRZEPATIDE 5 MG/0.5ML ~~LOC~~ SOAJ: 5.0000 mg | SUBCUTANEOUS | 1 refills | Status: DC

## 2023-09-02 NOTE — Progress Notes (Signed)
 BP 133/73   Pulse 66   Ht 6\' 4"  (1.93 m)   Wt (!) 360 lb (163.3 kg)   BMI 43.82 kg/m    Subjective:    Patient ID: William Reid, male    DOB: 24-Jul-1990, 33 y.o.   MRN: [id number]  HPI: William Reid is a 33 y.o. male  Chief Complaint  Patient presents with   Medical Management of Chronic Issues    Currently using 5mg  and has had some stomach issues   Ear Pain    Right side, started yesterday   DIABETES Had GI issues with hypoglycemia.  Stopped the Mounjaro and symptoms improved.  Symptoms have resolved.  Hasn't restarted the Mounjaro. Hypoglycemic episodes:yes- but have resolved  Polydipsia/polyuria: no Visual disturbance: no Chest pain: no Paresthesias: no Glucose Monitoring: no  Accucheck frequency: Not Checking  Fasting glucose:  Post prandial:  Evening:  Before meals: Taking Insulin?: no  Long acting insulin:  Short acting insulin:   Denies HA, CP, SOB, dizziness, palpitations, visual changes, and lower extremity swelling.   Relevant past medical, surgical, family and social history reviewed and updated as indicated. Interim medical history since our last visit reviewed. Allergies and medications reviewed and updated.  Review of Systems  Eyes:  Negative for visual disturbance.  Respiratory:  Negative for chest tightness and shortness of breath.   Cardiovascular:  Negative for chest pain, palpitations and leg swelling.  Endocrine: Negative for polydipsia and polyuria.  Neurological:  Negative for dizziness, light-headedness, numbness and headaches.    Per HPI unless specifically indicated above     Objective:     BP 133/73   Pulse 66   Ht 6\' 4"  (1.93 m)   Wt (!) 360 lb (163.3 kg)   BMI 43.82 kg/m   Wt Readings from Last 3 Encounters:  09/02/23 (!) 360 lb (163.3 kg)  08/19/23 (!) 354 lb (160.6 kg)  08/17/23 (!) 354 lb 3.2 oz (160.7 kg)    Physical Exam Vitals and nursing note reviewed.  Constitutional:      General: He is not in acute  distress.    Appearance: Normal appearance. He is obese. He is not ill-appearing, toxic-appearing or diaphoretic.  HENT:     Head: Normocephalic.     Right Ear: External ear normal.     Left Ear: External ear normal.     Nose: Nose normal. No congestion or rhinorrhea.     Mouth/Throat:     Mouth: Mucous membranes are moist.  Eyes:     General:        Right eye: No discharge.        Left eye: No discharge.     Extraocular Movements: Extraocular movements intact.     Conjunctiva/sclera: Conjunctivae normal.     Pupils: Pupils are equal, round, and reactive to light.  Cardiovascular:     Rate and Rhythm: Normal rate and regular rhythm.     Heart sounds: No murmur heard. Pulmonary:     Effort: Pulmonary effort is normal. No respiratory distress.     Breath sounds: Normal breath sounds. No wheezing, rhonchi or rales.  Abdominal:     General: Abdomen is flat. Bowel sounds are normal.  Musculoskeletal:     Cervical back: Normal range of motion and neck supple.  Skin:    General: Skin is warm and dry.     Capillary Refill: Capillary refill takes less than 2 seconds.  Neurological:     General: No focal deficit  present.     Mental Status: He is alert and oriented to person, place, and time.  Psychiatric:        Mood and Affect: Mood normal.        Behavior: Behavior normal.        Thought Content: Thought content normal.        Judgment: Judgment normal.     Results for orders placed or performed in visit on 08/19/23  Bayer DCA Hb A1c Waived   Collection Time: 08/19/23  3:48 PM  Result Value Ref Range   HB A1C (BAYER DCA - WAIVED) 6.4 (H) 4.8 - 5.6 %      Assessment & Plan:   Problem List Items Addressed This Visit       Endocrine   Uncontrolled type 2 diabetes mellitus with hyperglycemia (HCC) - Primary   Chronic.  Improved.  Symptoms of hypoglycemia have resolved since last visit.  Will continue with Mounjaro 5mg  instead of increasing to 7.5mg  weekly.  Follow up in 1  month.  Call sooner if concerns arise.       Relevant Medications   tirzepatide (MOUNJARO) 5 MG/0.5ML Pen     Follow up plan: Return in about 1 month (around 10/02/2023) for Medication Management.

## 2023-09-02 NOTE — Assessment & Plan Note (Signed)
 Chronic.  Improved.  Symptoms of hypoglycemia have resolved since last visit.  Will continue with Mounjaro 5mg  instead of increasing to 7.5mg  weekly.  Follow up in 1 month.  Call sooner if concerns arise.

## 2023-09-17 ENCOUNTER — Ambulatory Visit: Admitting: Nurse Practitioner

## 2023-09-28 ENCOUNTER — Ambulatory Visit: Admitting: Nurse Practitioner

## 2023-10-06 ENCOUNTER — Ambulatory Visit: Admitting: Nurse Practitioner

## 2023-10-14 ENCOUNTER — Ambulatory Visit: Admitting: Nurse Practitioner

## 2023-10-14 ENCOUNTER — Encounter: Payer: Self-pay | Admitting: Nurse Practitioner

## 2023-10-14 VITALS — BP 145/85 | HR 62 | Temp 98.4°F | Wt 355.0 lb

## 2023-10-14 DIAGNOSIS — E1165 Type 2 diabetes mellitus with hyperglycemia: Secondary | ICD-10-CM | POA: Diagnosis not present

## 2023-10-14 NOTE — Assessment & Plan Note (Signed)
 Chronic.  Improved.  Encouraged patient to pick up medication.  Discussed savings card with patient during visit.  Follow up in 2 months.  Praised for weight loss.  Continue with improved lifestyle.

## 2023-10-14 NOTE — Progress Notes (Signed)
 BP (!) 145/85   Pulse 62   Temp 98.4 F (36.9 C) (Oral)   Wt (!) 355 lb (161 kg)   SpO2 98%   BMI 43.21 kg/m    Subjective:    Patient ID: William Reid, male    DOB: [birth year], 33 y.o.   MRN: [id number]  HPI: William Reid is a 33 y.o. male  Chief Complaint  Patient presents with   Diabetes    Haven't been able to get medications due to family issues and unable to afford them    DIABETES Patient states he is having financial issues and having trouble affording his medications.  He has lost 5lbs since last visit.  Patient has lost 70lbs since last March.  He has been jogging.   Hypoglycemic episodes:yes- but have resolved  Polydipsia/polyuria: no Visual disturbance: no Chest pain: no Paresthesias: no Glucose Monitoring: no  Accucheck frequency: Not Checking  Fasting glucose:  Post prandial:  Evening:  Before meals: Taking Insulin?: no  Long acting insulin:  Short acting insulin:   Denies HA, CP, SOB, dizziness, palpitations, visual changes, and lower extremity swelling.   Relevant past medical, surgical, family and social history reviewed and updated as indicated. Interim medical history since our last visit reviewed. Allergies and medications reviewed and updated.  Review of Systems  Eyes:  Negative for visual disturbance.  Respiratory:  Negative for chest tightness and shortness of breath.   Cardiovascular:  Negative for chest pain, palpitations and leg swelling.  Endocrine: Negative for polydipsia and polyuria.  Neurological:  Negative for dizziness, light-headedness, numbness and headaches.    Per HPI unless specifically indicated above     Objective:     BP (!) 145/85   Pulse 62   Temp 98.4 F (36.9 C) (Oral)   Wt (!) 355 lb (161 kg)   SpO2 98%   BMI 43.21 kg/m   Wt Readings from Last 3 Encounters:  10/14/23 (!) 355 lb (161 kg)  09/02/23 (!) 360 lb (163.3 kg)  08/19/23 (!) 354 lb (160.6 kg)    Physical Exam Vitals and nursing note  reviewed.  Constitutional:      General: He is not in acute distress.    Appearance: Normal appearance. He is obese. He is not ill-appearing, toxic-appearing or diaphoretic.  HENT:     Head: Normocephalic.     Right Ear: External ear normal.     Left Ear: External ear normal.     Nose: Nose normal. No congestion or rhinorrhea.     Mouth/Throat:     Mouth: Mucous membranes are moist.  Eyes:     General:        Right eye: No discharge.        Left eye: No discharge.     Extraocular Movements: Extraocular movements intact.     Conjunctiva/sclera: Conjunctivae normal.     Pupils: Pupils are equal, round, and reactive to light.  Cardiovascular:     Rate and Rhythm: Normal rate and regular rhythm.     Heart sounds: No murmur heard. Pulmonary:     Effort: Pulmonary effort is normal. No respiratory distress.     Breath sounds: Normal breath sounds. No wheezing, rhonchi or rales.  Abdominal:     General: Abdomen is flat. Bowel sounds are normal.  Musculoskeletal:     Cervical back: Normal range of motion and neck supple.  Skin:    General: Skin is warm and dry.     Capillary Refill:  Capillary refill takes less than 2 seconds.  Neurological:     General: No focal deficit present.     Mental Status: He is alert and oriented to person, place, and time.  Psychiatric:        Mood and Affect: Mood normal.        Behavior: Behavior normal.        Thought Content: Thought content normal.        Judgment: Judgment normal.     Results for orders placed or performed in visit on 08/19/23  Bayer DCA Hb A1c Waived   Collection Time: 08/19/23  3:48 PM  Result Value Ref Range   HB A1C (BAYER DCA - WAIVED) 6.4 (H) 4.8 - 5.6 %      Assessment & Plan:   Problem List Items Addressed This Visit       Endocrine   Uncontrolled type 2 diabetes mellitus with hyperglycemia (HCC) - Primary   Chronic.  Improved.  Encouraged patient to pick up medication.  Discussed savings card with patient during  visit.  Follow up in 2 months.  Praised for weight loss.  Continue with improved lifestyle.         Follow up plan: Return in about 2 months (around 12/15/2023) for HTN, HLD, DM2 FU.

## 2023-12-07 ENCOUNTER — Other Ambulatory Visit: Payer: Self-pay

## 2023-12-07 ENCOUNTER — Emergency Department
Admission: EM | Admit: 2023-12-07 | Discharge: 2023-12-07 | Disposition: A | Discharge status: Home / Self Care | Attending: Emergency Medicine | Admitting: Emergency Medicine

## 2023-12-07 ENCOUNTER — Emergency Department

## 2023-12-07 DIAGNOSIS — Z79899 Other long term (current) drug therapy: Secondary | ICD-10-CM | POA: Diagnosis not present

## 2023-12-07 DIAGNOSIS — E119 Type 2 diabetes mellitus without complications: Secondary | ICD-10-CM | POA: Insufficient documentation

## 2023-12-07 DIAGNOSIS — R079 Chest pain, unspecified: Secondary | ICD-10-CM | POA: Diagnosis present

## 2023-12-07 LAB — CBC
HCT: 42 % (ref 39.0–52.0)
Hemoglobin: 13.9 g/dL (ref 13.0–17.0)
MCH: 29 pg (ref 26.0–34.0)
MCHC: 33.1 g/dL (ref 30.0–36.0)
MCV: 87.5 fL (ref 80.0–100.0)
Platelets: 243 K/uL (ref 150–400)
RBC: 4.8 MIL/uL (ref 4.22–5.81)
RDW: 12.4 % (ref 11.5–15.5)
WBC: 8.1 K/uL (ref 4.0–10.5)
nRBC: 0 % (ref 0.0–0.2)

## 2023-12-07 LAB — BASIC METABOLIC PANEL WITH GFR
Anion gap: 10 (ref 5–15)
BUN: 17 mg/dL (ref 6–20)
CO2: 24 mmol/L (ref 22–32)
Calcium: 8.9 mg/dL (ref 8.9–10.3)
Chloride: 104 mmol/L (ref 98–111)
Creatinine, Ser: 0.89 mg/dL (ref 0.61–1.24)
GFR, Estimated: >60 mL/min (ref >60)
Glucose, Bld: 94 mg/dL (ref 70–99)
Potassium: 3.6 mmol/L (ref 3.5–5.1)
Sodium: 138 mmol/L (ref 135–145)

## 2023-12-07 LAB — D-DIMER, QUANTITATIVE: D-Dimer, Quant: <0.27 ug{FEU}/mL (ref 0.00–0.50)

## 2023-12-07 LAB — TROPONIN I (HIGH SENSITIVITY)
Troponin I (High Sensitivity): 3 ng/L (ref <18)
Troponin I (High Sensitivity): 3 ng/L (ref <18)

## 2023-12-07 NOTE — ED Provider Notes (Signed)
 [facility] Provider Note    Event Date/Time   First MD Initiated Contact with Patient 12/07/23 [phone]     (approximate)   History   Chest Pain   HPI  William Reid is a 33 y.o. male with history of diabetes, seizures, allergies who presents to the emergency department with sharp and burning chest pain that radiated into his right arm with shortness of breath, nausea, dizziness like he was going to pass out tonight.  States symptoms came on at rest but also with exertion.  Denies any known aggravating or alleviating factors.  No history of PE or DVT.  No calf tenderness or calf swelling.  Denies fevers or cough.  Reports he is feeling better currently.   History provided by patient.    Past Medical History:  Diagnosis Date   Allergy    Seizures (HCC)     History reviewed. No pertinent surgical history.  MEDICATIONS:  Prior to Admission medications   Medication Sig Start Date End Date Taking? Authorizing Provider  rosuvastatin  (CRESTOR ) 5 MG tablet Take 1 tablet (5 mg total) by mouth daily. 06/15/23   Melvin Pao, NP  tirzepatide  (MOUNJARO ) 5 MG/0.5ML Pen Inject 5 mg into the skin once a week. Patient not taking: Reported on 10/14/2023 09/02/23   Melvin Pao, NP  valACYclovir  (VALTREX ) 500 MG tablet Take 1 tablet (500 mg total) by mouth daily. For daily suppression HSV Patient not taking: Reported on 10/14/2023 06/18/23   Edman Marsa PARAS, DO  valsartan  (DIOVAN ) 160 MG tablet Take 1 tablet (160 mg total) by mouth daily. 07/01/23   Melvin Pao, NP    Physical Exam   Triage Vital Signs: ED Triage Vitals  Encounter Vitals Group     BP 12/07/23 0251 (!) 174/98     Girls Systolic BP Percentile --      Girls Diastolic BP Percentile --      Boys Systolic BP Percentile --      Boys Diastolic BP Percentile --      Pulse Rate 12/07/23 0250 63     Resp 12/07/23 0250 20     Temp 12/07/23 0250 98.4 F (36.9 C)     Temp Source  12/07/23 0250 Oral     SpO2 12/07/23 0250 99 %     Weight 12/07/23 0247 (!) 350 lb (158.8 kg)     Height 12/07/23 0247 6' 3 (1.905 m)     Head Circumference --      Peak Flow --      Pain Score 12/07/23 0247 6     Pain Loc --      Pain Education --      Exclude from Growth Chart --     Most recent vital signs: Vitals:   12/07/23 0657 12/07/23 0716  BP:  (!) 140/89  Pulse:  60  Resp:  18  Temp: 98.2 F (36.8 C) 98.1 F (36.7 C)  SpO2:  99%    CONSTITUTIONAL: Alert, responds appropriately to questions. Well-appearing; well-nourished HEAD: Normocephalic, atraumatic EYES: Conjunctivae clear, pupils appear equal, sclera nonicteric ENT: normal nose; moist mucous membranes NECK: Supple, normal ROM CARD: RRR; S1 and S2 appreciated RESP: Normal chest excursion without splinting or tachypnea; breath sounds clear and equal bilaterally; no wheezes, no rhonchi, no rales, no hypoxia or respiratory distress, speaking full sentences ABD/GI: Non-distended; soft, non-tender, no rebound, no guarding, no peritoneal signs BACK: The back appears normal EXT: Normal ROM in all joints; no deformity noted,  no edema, no calf tenderness or calf swelling SKIN: Normal color for age and race; warm; no rash on exposed skin NEURO: Moves all extremities equally, normal speech PSYCH: The patient's mood and manner are appropriate.   ED Results / Procedures / Treatments   LABS: (all labs ordered are listed, but only abnormal results are displayed) Labs Reviewed  BASIC METABOLIC PANEL WITH GFR  CBC  D-DIMER, QUANTITATIVE  TROPONIN I (HIGH SENSITIVITY)  TROPONIN I (HIGH SENSITIVITY)     EKG:  EKG Interpretation Date/Time:  Tuesday December 07 2023 02:48:43 EDT Ventricular Rate:  64 PR Interval:  166 QRS Duration:  92 QT Interval:  398 QTC Calculation: 410 R Axis:   97  Text Interpretation: Normal sinus rhythm Rightward axis Cannot rule out Anterior infarct , age undetermined Abnormal ECG  When compared with ECG of 03-Oct-2009 10:17, Questionable change in QRS axis Confirmed by Neomi Neptune [phone]) on 12/07/2023 5:08:38 AM         RADIOLOGY: My personal review and interpretation of imaging: Chest x-ray clear.  I have personally reviewed all radiology reports.   DG Chest Port 1 View Result Date: 12/07/2023 EXAM: 1 VIEW XRAY OF THE CHEST 12/07/2023 03:22:45 AM COMPARISON: 03/08/2007. CLINICAL HISTORY: 355200 Chest pain [id number]. PER ER NOTE; Pt to ED via EMS from gym, pt got on treadmill tonight and began to have chest pain and right arm tightness, pt has had inc stress and dec sleep over the past few weeks and inc in caffeine intake. Pt reports he has not been consistently taking his prescribed HTN meds. FINDINGS: LUNGS AND PLEURA: No focal pulmonary opacity. No pulmonary edema. No pleural effusion. No pneumothorax. HEART AND MEDIASTINUM: No acute abnormality of the cardiac and mediastinal silhouettes. BONES AND SOFT TISSUES: No acute osseous abnormality. IMPRESSION: 1. No acute process. Electronically signed by: Norman Gatlin MD 12/07/2023 03:32 AM EDT RP Workstation: [id number]     PROCEDURES:  Critical Care performed: No     .1-3 Lead EKG Interpretation  Performed by: Mikai Meints, Neptune SAILOR, DO Authorized by: Seldon Barrell, Neptune SAILOR, DO     Interpretation: normal     ECG rate:  60   ECG rate assessment: normal     Rhythm: sinus rhythm     Ectopy: none     Conduction: normal       IMPRESSION / MDM / ASSESSMENT AND PLAN / ED COURSE  I reviewed the triage vital signs and the nursing notes.    Patient here with atypical chest pain, shortness of breath, dizziness that he describes as near syncope/lightheadedness.  The patient is on the cardiac monitor to evaluate for evidence of arrhythmia and/or significant heart rate changes.   DIFFERENTIAL DIAGNOSIS (includes but not limited to):   Panic attack, PE, arrhythmia, anemia, electrolyte derangement, ACS, doubt dissection, CHF,  pneumothorax   Patient's presentation is most consistent with acute presentation with potential threat to life or bodily function.   PLAN: Normal hemoglobin, electrolytes.  First troponin negative.  Second troponin pending.  Will also obtain D-dimer.  Patient reports feeling better.  He is asking if he can eat and drink.  Will provide with food, water.   MEDICATIONS GIVEN IN ED: Medications - No data to display   ED COURSE: Second troponin negative.  D-dimer negative.  Patient able to eat and drink here.  Hemodynamically stable.  Still asymptomatic.  I feel he is safe for discharge home.  Given outpatient follow-up.  No evidence seen on cardiac monitoring.  At this time, I do not feel there is any life-threatening condition present. I reviewed all nursing notes, vitals, pertinent previous records.  All lab and urine results, EKGs, imaging ordered have been independently reviewed and interpreted by myself.  I reviewed all available radiology reports from any imaging ordered this visit.  Based on my assessment, I feel the patient is safe to be discharged home without further emergent workup and can continue workup as an outpatient as needed. Discussed all findings, treatment plan as well as usual and customary return precautions.  They verbalize understanding and are comfortable with this plan.  Outpatient follow-up has been provided as needed.  All questions have been answered.       CONSULTS:  none   OUTSIDE RECORDS REVIEWED: Reviewed last family medicine visit on 10/14/2023.       FINAL CLINICAL IMPRESSION(S) / ED DIAGNOSES   Final diagnoses:  Nonspecific chest pain     Rx / DC Orders   ED Discharge Orders     None        Note:  This document was prepared using Dragon voice recognition software and may include unintentional dictation errors.   Shabree Tebbetts, Josette SAILOR, DO 12/07/23 [phone]

## 2023-12-07 NOTE — ED Triage Notes (Signed)
 Pt to ED via EMS from gym, pt got on treadmill tonight and began to have chest pain and right arm tightness, pt has had inc stress and dec sleep over the past few weeks and inc in caffeine intake. Pt reports he has not been consistently taking his prescribed HTN meds.

## 2023-12-16 ENCOUNTER — Encounter: Payer: Self-pay | Admitting: Nurse Practitioner

## 2023-12-16 ENCOUNTER — Ambulatory Visit (INDEPENDENT_AMBULATORY_CARE_PROVIDER_SITE_OTHER): Admitting: Nurse Practitioner

## 2023-12-16 VITALS — BP 114/69 | HR 65 | Temp 98.0°F | Ht 76.0 in | Wt 342.8 lb

## 2023-12-16 DIAGNOSIS — F323 Major depressive disorder, single episode, severe with psychotic features: Secondary | ICD-10-CM | POA: Diagnosis not present

## 2023-12-16 DIAGNOSIS — I10 Essential (primary) hypertension: Secondary | ICD-10-CM

## 2023-12-16 DIAGNOSIS — Z7985 Long-term (current) use of injectable non-insulin antidiabetic drugs: Secondary | ICD-10-CM

## 2023-12-16 DIAGNOSIS — E1165 Type 2 diabetes mellitus with hyperglycemia: Secondary | ICD-10-CM

## 2023-12-16 DIAGNOSIS — A6001 Herpesviral infection of penis: Secondary | ICD-10-CM

## 2023-12-16 DIAGNOSIS — E119 Type 2 diabetes mellitus without complications: Secondary | ICD-10-CM

## 2023-12-16 LAB — MICROALBUMIN, URINE WAIVED
Creatinine, Urine Waived: 200 mg/dL (ref 10–300)
Microalb, Ur Waived: 30 mg/L — ABNORMAL HIGH (ref 0–19)
Microalb/Creat Ratio: <30 mg/g (ref <30)

## 2023-12-16 MED ORDER — VALACYCLOVIR HCL 500 MG PO TABS: 500.0000 mg | ORAL_TABLET | Freq: Every day | ORAL | 0 refills | Status: AC

## 2023-12-16 NOTE — Progress Notes (Signed)
 BP 114/69   Pulse 65   Temp 98 F (36.7 C) (Oral)   Ht 6' 4 (1.93 m)   Wt (!) 342 lb 12.8 oz (155.5 kg)   SpO2 98%   BMI 41.73 kg/m    Subjective:    Patient ID: William Reid, male    DOB: Apr 24, 1990, 33 y.o.   MRN: [id number]  HPI: William Reid is a 33 y.o. male  Chief Complaint  Patient presents with   Depression   Diabetes   Hypertension   HYPERTENSION / HYPERLIPIDEMIA Satisfied with current treatment? yes Duration of hypertension: years BP monitoring frequency: not checking BP range:  BP medication side effects: no Past BP meds: valsartan  Duration of hyperlipidemia: years Cholesterol medication side effects: no Cholesterol supplements: none Past cholesterol medications: rosuvastatin  (crestor ) Medication compliance: excellent compliance Aspirin: no Recent stressors: no Recurrent headaches: no Visual changes: no Palpitations: no Dyspnea: no Chest pain: no Lower extremity edema: no Dizzy/lightheaded: no  DIABETES Patient states he is not taking the Mounjaro .  He is in the gym 4-5 times per week and losing.   Hypoglycemic episodes:yes Polydipsia/polyuria: no Visual disturbance: no Chest pain: no Paresthesias: no Glucose Monitoring: no  Accucheck frequency: Not Checking  Fasting glucose:  Post prandial:  Evening:  Before meals: Taking Insulin?: no  Long acting insulin:  Short acting insulin: Blood Pressure Monitoring: not checking Retinal Examination: Not up to Date Foot Exam: Up to Date Diabetic Education: Not Completed Pneumovax: Up to Date Influenza: Up to Date Aspirin: no    Relevant past medical, surgical, family and social history reviewed and updated as indicated. Interim medical history since our last visit reviewed. Allergies and medications reviewed and updated.  Review of Systems  Eyes:  Negative for visual disturbance.  Respiratory:  Negative for chest tightness and shortness of breath.   Cardiovascular:  Negative for  chest pain, palpitations and leg swelling.  Endocrine: Negative for polydipsia and polyuria.  Neurological:  Negative for dizziness, light-headedness, numbness and headaches.    Per HPI unless specifically indicated above     Objective:    BP 114/69   Pulse 65   Temp 98 F (36.7 C) (Oral)   Ht 6' 4 (1.93 m)   Wt (!) 342 lb 12.8 oz (155.5 kg)   SpO2 98%   BMI 41.73 kg/m   Wt Readings from Last 3 Encounters:  12/16/23 (!) 342 lb 12.8 oz (155.5 kg)  12/07/23 (!) 350 lb (158.8 kg)  10/14/23 (!) 355 lb (161 kg)    Physical Exam Vitals and nursing note reviewed.  Constitutional:      General: He is not in acute distress.    Appearance: Normal appearance. He is obese. He is not ill-appearing, toxic-appearing or diaphoretic.  HENT:     Head: Normocephalic.     Right Ear: External ear normal.     Left Ear: External ear normal.     Nose: Nose normal. No congestion or rhinorrhea.     Mouth/Throat:     Mouth: Mucous membranes are moist.  Eyes:     General:        Right eye: No discharge.        Left eye: No discharge.     Extraocular Movements: Extraocular movements intact.     Conjunctiva/sclera: Conjunctivae normal.     Pupils: Pupils are equal, round, and reactive to light.  Cardiovascular:     Rate and Rhythm: Normal rate and regular rhythm.  Heart sounds: No murmur heard. Pulmonary:     Effort: Pulmonary effort is normal. No respiratory distress.     Breath sounds: Normal breath sounds. No wheezing, rhonchi or rales.  Abdominal:     General: Abdomen is flat. Bowel sounds are normal.  Musculoskeletal:     Cervical back: Normal range of motion and neck supple.  Skin:    General: Skin is warm and dry.     Capillary Refill: Capillary refill takes less than 2 seconds.  Neurological:     General: No focal deficit present.     Mental Status: He is alert and oriented to person, place, and time.  Psychiatric:        Mood and Affect: Mood normal.        Behavior:  Behavior normal.        Thought Content: Thought content normal.        Judgment: Judgment normal.     Results for orders placed or performed during the hospital encounter of 12/07/23  Basic metabolic panel   Collection Time: 12/07/23  2:51 AM  Result Value Ref Range   Sodium 138 135 - 145 mmol/L   Potassium 3.6 3.5 - 5.1 mmol/L   Chloride 104 98 - 111 mmol/L   CO2 24 22 - 32 mmol/L   Glucose, Bld 94 70 - 99 mg/dL   BUN 17 6 - 20 mg/dL   Creatinine, Ser 9.10 0.61 - 1.24 mg/dL   Calcium  8.9 8.9 - 10.3 mg/dL   GFR, Estimated >39 >39 mL/min   Anion gap 10 5 - 15  CBC   Collection Time: 12/07/23  2:51 AM  Result Value Ref Range   WBC 8.1 4.0 - 10.5 K/uL   RBC 4.80 4.22 - 5.81 MIL/uL   Hemoglobin 13.9 13.0 - 17.0 g/dL   HCT 57.9 60.9 - 47.9 %   MCV 87.5 80.0 - 100.0 fL   MCH 29.0 26.0 - 34.0 pg   MCHC 33.1 30.0 - 36.0 g/dL   RDW 87.5 88.4 - 84.4 %   Platelets 243 150 - 400 K/uL   nRBC 0.0 0.0 - 0.2 %  Troponin I (High Sensitivity)   Collection Time: 12/07/23  2:51 AM  Result Value Ref Range   Troponin I (High Sensitivity) 3 <18 ng/L  D-dimer, quantitative   Collection Time: 12/07/23  6:05 AM  Result Value Ref Range   D-Dimer, Quant <0.27 0.00 - 0.50 ug/mL-FEU  Troponin I (High Sensitivity)   Collection Time: 12/07/23  6:05 AM  Result Value Ref Range   Troponin I (High Sensitivity) 3 <18 ng/L      Assessment & Plan:   Problem List Items Addressed This Visit       Cardiovascular and Mediastinum   Primary hypertension   Chronic.  Controlled.  Continue with current medication regimen of valsartan  daily.   Return to clinic in 6 months for reevaluation.  Call sooner if concerns arise.         Endocrine   Uncontrolled type 2 diabetes mellitus with hyperglycemia (HCC) - Primary   Chronic.  Improved.  Patient has been working on lifestyle changes and elects not to take any medication a this time.  He has continued to lose weight since last visit.  He has lost almost  100lbs since March 2024.  Follow up in 6 months.  Praised for weight loss.  Continue with improved lifestyle.      Relevant Orders   Comprehensive metabolic panel with GFR  Hemoglobin A1c   Lipid panel   Microalbumin, Urine Waived   Diabetes mellitus treated with injections of non-insulin medication (HCC)     Other   Severe major depression, single episode, with psychotic features (HCC)   Chronic.  Controlled without medication.  Labs ordered today.  Return to clinic in 6 months for reevaluation.  Call sooner if concerns arise.         Morbid obesity (HCC)   Continues with weight loss. Praised for his efforts!      Other Visit Diagnoses       Herpes simplex infection of penis       Relevant Medications   valACYclovir  (VALTREX ) 500 MG tablet        Follow up plan: Return in about 6 months (around 06/14/2024) for HTN, HLD, DM2 FU.

## 2023-12-16 NOTE — Assessment & Plan Note (Signed)
 Chronic.  Controlled without medication..  Labs ordered today.  Return to clinic in 6 months for reevaluation.  Call sooner if concerns arise.

## 2023-12-16 NOTE — Assessment & Plan Note (Signed)
 Continues with weight loss. Praised for his efforts!

## 2023-12-16 NOTE — Assessment & Plan Note (Signed)
 Chronic.  Improved.  Patient has been working on lifestyle changes and elects not to take any medication a this time.  He has continued to lose weight since last visit.  He has lost almost 100lbs since March 2024.  Follow up in 6 months.  Praised for weight loss.  Continue with improved lifestyle.

## 2023-12-16 NOTE — Assessment & Plan Note (Signed)
 Chronic.  Controlled.  Continue with current medication regimen of valsartan  daily.   Return to clinic in 6 months for reevaluation.  Call sooner if concerns arise.

## 2023-12-17 ENCOUNTER — Ambulatory Visit: Payer: Self-pay | Admitting: Nurse Practitioner

## 2023-12-17 LAB — COMPREHENSIVE METABOLIC PANEL WITH GFR
ALT: 26 IU/L (ref 0–44)
AST: 25 IU/L (ref 0–40)
Albumin: 4.5 g/dL (ref 4.1–5.1)
Alkaline Phosphatase: 90 IU/L (ref 47–123)
BUN/Creatinine Ratio: 15 (ref 9–20)
BUN: 14 mg/dL (ref 6–20)
Bilirubin Total: 0.6 mg/dL (ref 0.0–1.2)
CO2: 22 mmol/L (ref 20–29)
Calcium: 9.3 mg/dL (ref 8.7–10.2)
Chloride: 102 mmol/L (ref 96–106)
Creatinine, Ser: 0.94 mg/dL (ref 0.76–1.27)
Globulin, Total: 2.8 g/dL (ref 1.5–4.5)
Glucose: 79 mg/dL (ref 70–99)
Potassium: 4.4 mmol/L (ref 3.5–5.2)
Sodium: 139 mmol/L (ref 134–144)
Total Protein: 7.3 g/dL (ref 6.0–8.5)
eGFR: 110 mL/min/1.73 (ref >59)

## 2023-12-17 LAB — LIPID PANEL
Chol/HDL Ratio: 4.3 ratio (ref 0.0–5.0)
Cholesterol, Total: 132 mg/dL (ref 100–199)
HDL: 31 mg/dL — ABNORMAL LOW (ref >39)
LDL Chol Calc (NIH): 81 mg/dL (ref 0–99)
Triglycerides: 109 mg/dL (ref 0–149)
VLDL Cholesterol Cal: 20 mg/dL (ref 5–40)

## 2023-12-17 LAB — HEMOGLOBIN A1C
Est. average glucose Bld gHb Est-mCnc: 114 mg/dL
Hgb A1c MFr Bld: 5.6 % (ref 4.8–5.6)

## 2024-06-14 ENCOUNTER — Ambulatory Visit: Admitting: Nurse Practitioner
